# Patient Record
Sex: Male | Born: 1985 | Race: White | Hispanic: No | Marital: Single | Smoking: Current every day smoker
Health system: Southern US, Community
[De-identification: ages and names within clinical notes are randomized; demographics above are authoritative.]

## PROBLEM LIST (undated history)

## (undated) DIAGNOSIS — F419 Anxiety disorder, unspecified: Secondary | ICD-10-CM

## (undated) DIAGNOSIS — F410 Panic disorder [episodic paroxysmal anxiety] without agoraphobia: Secondary | ICD-10-CM

## (undated) HISTORY — PX: ANKLE FRACTURE SURGERY: SHX122

---
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
## 2015-11-14 ENCOUNTER — Emergency Department
Admission: EM | Admit: 2015-11-14 | Discharge: 2015-11-14 | Disposition: A | Discharge status: Home / Self Care | Payer: Self-pay | Attending: Emergency Medicine | Admitting: Emergency Medicine

## 2015-11-14 ENCOUNTER — Encounter: Payer: Self-pay | Admitting: *Deleted

## 2015-11-14 DIAGNOSIS — F17200 Nicotine dependence, unspecified, uncomplicated: Secondary | ICD-10-CM | POA: Insufficient documentation

## 2015-11-14 DIAGNOSIS — F1193 Opioid use, unspecified with withdrawal: Secondary | ICD-10-CM

## 2015-11-14 DIAGNOSIS — F1123 Opioid dependence with withdrawal: Secondary | ICD-10-CM | POA: Insufficient documentation

## 2015-11-14 LAB — CBC WITH DIFFERENTIAL/PLATELET
BASOS ABS: 0 10*3/uL (ref 0–0.1)
BASOS PCT: 0 %
EOS ABS: 0 10*3/uL (ref 0–0.7)
EOS PCT: 0 %
HCT: 47.5 % (ref 40.0–52.0)
HEMOGLOBIN: 16.2 g/dL (ref 13.0–18.0)
LYMPHS ABS: 2 10*3/uL (ref 1.0–3.6)
Lymphocytes Relative: 22 %
MCH: 28.8 pg (ref 26.0–34.0)
MCHC: 34 g/dL (ref 32.0–36.0)
MCV: 84.7 fL (ref 80.0–100.0)
Monocytes Absolute: 0.5 10*3/uL (ref 0.2–1.0)
Monocytes Relative: 6 %
NEUTROS PCT: 72 %
Neutro Abs: 6.6 10*3/uL — ABNORMAL HIGH (ref 1.4–6.5)
PLATELETS: 273 10*3/uL (ref 150–440)
RBC: 5.61 MIL/uL (ref 4.40–5.90)
RDW: 14.9 % — ABNORMAL HIGH (ref 11.5–14.5)
WBC: 9.2 10*3/uL (ref 3.8–10.6)

## 2015-11-14 LAB — COMPREHENSIVE METABOLIC PANEL
ALK PHOS: 122 U/L (ref 38–126)
ALT: 94 U/L — ABNORMAL HIGH (ref 17–63)
ANION GAP: 8 (ref 5–15)
AST: 40 U/L (ref 15–41)
Albumin: 4.5 g/dL (ref 3.5–5.0)
BUN: 14 mg/dL (ref 6–20)
CALCIUM: 9.7 mg/dL (ref 8.9–10.3)
CO2: 27 mmol/L (ref 22–32)
CREATININE: 0.74 mg/dL (ref 0.61–1.24)
Chloride: 104 mmol/L (ref 101–111)
Glucose, Bld: 96 mg/dL (ref 65–99)
Potassium: 3.7 mmol/L (ref 3.5–5.1)
SODIUM: 139 mmol/L (ref 135–145)
Total Bilirubin: 0.5 mg/dL (ref 0.3–1.2)
Total Protein: 8 g/dL (ref 6.5–8.1)

## 2015-11-14 LAB — URINE DRUG SCREEN, QUALITATIVE (ARMC ONLY)
AMPHETAMINES, UR SCREEN: NOT DETECTED
BARBITURATES, UR SCREEN: NOT DETECTED
BENZODIAZEPINE, UR SCRN: POSITIVE — AB
Cannabinoid 50 Ng, Ur ~~LOC~~: NOT DETECTED
Cocaine Metabolite,Ur ~~LOC~~: NOT DETECTED
MDMA (Ecstasy)Ur Screen: NOT DETECTED
METHADONE SCREEN, URINE: NOT DETECTED
Opiate, Ur Screen: POSITIVE — AB
Phencyclidine (PCP) Ur S: NOT DETECTED
TRICYCLIC, UR SCREEN: NOT DETECTED

## 2015-11-14 LAB — ACETAMINOPHEN LEVEL

## 2015-11-14 LAB — ETHANOL

## 2015-11-14 LAB — SALICYLATE LEVEL

## 2015-11-14 MED ORDER — DIAZEPAM 5 MG PO TABS
10.0000 mg | ORAL_TABLET | Freq: Once | ORAL | Status: AC
  Administered 2015-11-14: 10 mg via ORAL
  Filled 2015-11-14: qty 2

## 2015-11-14 MED ORDER — CYCLOBENZAPRINE HCL 10 MG PO TABS: 10.0000 mg | ORAL_TABLET | Freq: Three times a day (TID) | ORAL | 0 refills | Status: DC | PRN

## 2015-11-14 MED ORDER — ONDANSETRON 4 MG PO TBDP
4.0000 mg | ORAL_TABLET | Freq: Once | ORAL | Status: AC
  Administered 2015-11-14: 4 mg via ORAL
  Filled 2015-11-14: qty 1

## 2015-11-14 MED ORDER — PROMETHAZINE HCL 25 MG PO TABS: 25.0000 mg | ORAL_TABLET | ORAL | 1 refills | Status: DC | PRN

## 2015-11-14 MED ORDER — CLONIDINE HCL 0.1 MG/24HR TD PTWK: 0.1000 mg | MEDICATED_PATCH | TRANSDERMAL | 11 refills | Status: DC

## 2015-11-14 NOTE — ED Triage Notes (Signed)
Patient states he has been prescribed opiates for several years for several years for injuries and wants to detox. 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 Patient states he last used heroin 3 days ago and took a Suboxone twice since then to help with withdrawals, but is having difficulty dealing with the withdrawals.

## 2015-11-14 NOTE — ED Provider Notes (Signed)
[facility] Emergency Department Provider Note        Time seen: ----------------------------------------- 1:22 PM on 11/14/2015 -----------------------------------------    I have reviewed the triage vital signs and the nursing notes.   HISTORY  Chief Complaint Withdrawal    HPI Robert Miranda is a 30 y.o. male who presents to the ER for help with opiate detox. Patient states he can prescribe opiates for several years due to several injuries and toothaches and wants detox. Patient states she is now taking IV heroin, he has not used the last 3 days. He said stomach upset, vomiting, diarrhea, chills and has restlessness.   History reviewed. No pertinent past medical history.  There are no active problems to display for this patient.   Past Surgical History:  Procedure Laterality Date  . ANKLE FRACTURE SURGERY Left     Allergies Review of patient's allergies indicates no known allergies.  Social History Social History  Substance Use Topics  . Smoking status: Current Every Day Smoker  . Smokeless tobacco: Never Used  . Alcohol use No    Review of Systems Constitutional: Negative for fever.Positive for chills Cardiovascular: Negative for chest pain. Respiratory: Negative for shortness of breath. Gastrointestinal: Positive for abdominal pain, vomiting and diarrhea Genitourinary: Negative for dysuria. Musculoskeletal: Negative for back pain. Skin: Negative for rash. Neurological: Negative for headaches, focal weakness or numbness.  10-point ROS otherwise negative.  ____________________________________________   PHYSICAL EXAM:  VITAL SIGNS: ED Triage Vitals  Enc Vitals Group     BP 11/14/15 1124 116/72     Pulse Rate 11/14/15 1124 95     Resp 11/14/15 1124 18     Temp 11/14/15 1124 98.5 F (36.9 C)     Temp Source 11/14/15 1124 Oral     SpO2 11/14/15 1124 100 %     Weight 11/14/15 1124 165 lb (74.8 kg)     Height 11/14/15 1124  6\' 1"  (1.854 m)     Head Circumference --      Peak Flow --      Pain Score 11/14/15 1125 9     Pain Loc --      Pain Edu? --      Excl. in GC? --     Constitutional: Alert and oriented. Well appearing and in no distress. Eyes: Conjunctivae are normal. PERRL. Normal extraocular movements. ENT   Head: Normocephalic and atraumatic.   Nose: No congestion/rhinnorhea.   Mouth/Throat: Mucous membranes are moist.   Neck: No stridor. Cardiovascular: Normal rate, regular rhythm. No murmurs, rubs, or gallops. Respiratory: Normal respiratory effort without tachypnea nor retractions. Breath sounds are clear and equal bilaterally. No wheezes/rales/rhonchi. Gastrointestinal: Soft and nontender. Normal bowel sounds Musculoskeletal: Nontender with normal range of motion in all extremities. No lower extremity tenderness nor edema. Neurologic:  Normal speech and language. No gross focal neurologic deficits are appreciated.  Skin:  Skin is warm, dry and intact. No rash noted. Psychiatric: Mood and affect are normal. Speech and behavior are normal.  ____________________________________________  ED COURSE:  Pertinent labs & imaging results that were available during my care of the patient were reviewed by me and considered in my medical decision making (see chart for details). Clinical Course  Patient is no distress, I will provide medications to ease his withdrawal, we will discuss with the behavioral health intake nurse for outpatient detox referral.  Procedures ____________________________________________   LABS (pertinent positives/negatives)  Labs Reviewed  COMPREHENSIVE METABOLIC PANEL - Abnormal; Notable for the  following:       Result Value   ALT 94 (*)    All other components within normal limits  CBC WITH DIFFERENTIAL/PLATELET - Abnormal; Notable for the following:    RDW 14.9 (*)    Neutro Abs 6.6 (*)    All other components within normal limits  ACETAMINOPHEN LEVEL -  Abnormal; Notable for the following:    Acetaminophen (Tylenol), Serum <10 (*)    All other components within normal limits  URINE DRUG SCREEN, QUALITATIVE ([facility] ONLY) - Abnormal; Notable for the following:    Opiate, Ur Screen POSITIVE (*)    Benzodiazepine, Ur Scrn POSITIVE (*)    All other components within normal limits  ETHANOL  SALICYLATE LEVEL   ____________________________________________  FINAL ASSESSMENT AND PLAN  Substance abuse  Plan: Patient with labs as dictated above. Patient is in no distress, I will provide symptomatic treatment, he's been given outpatient referral information for opioid detox.   Emily FilbertWilliams, Jonathan E, MD   Note: This dictation was prepared with Dragon dictation. Any transcriptional errors that result from this process are unintentional    Emily FilbertJonathan E Williams, MD 11/14/15 1424

## 2017-06-21 ENCOUNTER — Encounter: Payer: Self-pay | Admitting: Emergency Medicine

## 2017-06-21 ENCOUNTER — Emergency Department
Admission: EM | Admit: 2017-06-21 | Discharge: 2017-06-21 | Discharge status: Against Medical Advice | Payer: Self-pay | Attending: Emergency Medicine | Admitting: Emergency Medicine

## 2017-06-21 DIAGNOSIS — F17200 Nicotine dependence, unspecified, uncomplicated: Secondary | ICD-10-CM | POA: Insufficient documentation

## 2017-06-21 DIAGNOSIS — R0789 Other chest pain: Secondary | ICD-10-CM | POA: Insufficient documentation

## 2017-06-21 HISTORY — DX: Anxiety disorder, unspecified: F41.9

## 2017-06-21 HISTORY — DX: Panic disorder (episodic paroxysmal anxiety): F41.0

## 2017-06-21 LAB — URINALYSIS, COMPLETE (UACMP) WITH MICROSCOPIC
BILIRUBIN URINE: NEGATIVE
GLUCOSE, UA: NEGATIVE mg/dL
Hgb urine dipstick: NEGATIVE
KETONES UR: NEGATIVE mg/dL
Leukocytes, UA: NEGATIVE
Nitrite: NEGATIVE
PROTEIN: 30 mg/dL — AB
Specific Gravity, Urine: 1.03 (ref 1.005–1.030)
pH: 6 (ref 5.0–8.0)

## 2017-06-21 LAB — COMPREHENSIVE METABOLIC PANEL
ALBUMIN: 4.9 g/dL (ref 3.5–5.0)
ALK PHOS: 94 U/L (ref 38–126)
ALT: 75 U/L — AB (ref 17–63)
AST: 35 U/L (ref 15–41)
Anion gap: 10 (ref 5–15)
BILIRUBIN TOTAL: 0.6 mg/dL (ref 0.3–1.2)
BUN: 13 mg/dL (ref 6–20)
CO2: 26 mmol/L (ref 22–32)
CREATININE: 0.86 mg/dL (ref 0.61–1.24)
Calcium: 9.4 mg/dL (ref 8.9–10.3)
Chloride: 102 mmol/L (ref 101–111)
GFR calc Af Amer: >60 mL/min (ref >60)
GLUCOSE: 94 mg/dL (ref 65–99)
POTASSIUM: 3.2 mmol/L — AB (ref 3.5–5.1)
Sodium: 138 mmol/L (ref 135–145)
TOTAL PROTEIN: 8 g/dL (ref 6.5–8.1)

## 2017-06-21 LAB — TROPONIN I

## 2017-06-21 LAB — CBC
HEMATOCRIT: 44 % (ref 40.0–52.0)
HEMOGLOBIN: 14.9 g/dL (ref 13.0–18.0)
MCH: 29 pg (ref 26.0–34.0)
MCHC: 33.8 g/dL (ref 32.0–36.0)
MCV: 86 fL (ref 80.0–100.0)
Platelets: 263 10*3/uL (ref 150–440)
RBC: 5.12 MIL/uL (ref 4.40–5.90)
RDW: 13 % (ref 11.5–14.5)
WBC: 11.1 10*3/uL — AB (ref 3.8–10.6)

## 2017-06-21 MED ORDER — IBUPROFEN 400 MG PO TABS
400.0000 mg | ORAL_TABLET | Freq: Once | ORAL | Status: DC
  Filled 2017-06-21: qty 1

## 2017-06-21 MED ORDER — ACETAMINOPHEN 325 MG PO TABS
650.0000 mg | ORAL_TABLET | Freq: Once | ORAL | Status: DC
  Filled 2017-06-21: qty 2

## 2017-06-21 NOTE — ED Provider Notes (Signed)
[facility] Emergency Department Provider Note   ____________________________________________    I have reviewed the triage vital signs and the nursing notes.   HISTORY  Chief Complaint Chest pain, chronic abdominal pain, moles    HPI Robert Miranda is a 32 y.o. male who presents with multiple complaints.  Patient states he has had 3 episodes of feeling lightheaded over the last 2 weeks, he states that he fell one time but denies injury.  He also states that he has had chest pain intermittently over the last year which he describes as a brief sharp needlelike pain.  Denies shortness of breath.  No fevers or chills.  Also complains of chronic abdominal pain for which she requests pain medication.  Additionally complains of 2 moles on his lower abdomen.  No history of heart disease   Past Medical History:  Diagnosis Date  . Anxiety   . Panic attack     There are no active problems to display for this patient.   Past Surgical History:  Procedure Laterality Date  . ANKLE FRACTURE SURGERY Left     Prior to Admission medications   Medication Sig Start Date End Date Taking? Authorizing Provider  cloNIDine (CATAPRES - DOSED IN MG/24 HR) 0.1 mg/24hr patch Place 1 patch (0.1 mg total) onto the skin every 7 (seven) days. 11/14/15 11/13/16  Emily Filbert, MD  cyclobenzaprine (FLEXERIL) 10 MG tablet Take 1 tablet (10 mg total) by mouth 3 (three) times daily as needed for muscle spasms. 11/14/15   Emily Filbert, MD  promethazine (PHENERGAN) 25 MG tablet Take 1 tablet (25 mg total) by mouth every 4 (four) hours as needed for nausea or vomiting. 11/14/15   Emily Filbert, MD     Allergies Patient has no known allergies.  No family history on file.  Social History Social History   Tobacco Use  . Smoking status: Current Every Day Smoker  . Smokeless tobacco: Never Used  Substance Use Topics  . Alcohol use: No  . Drug use: Yes   Types: IV    Comment: Heroin    Review of Systems  Constitutional: No fever/chills Eyes: No visual changes.  ENT: No neck pain Cardiovascular: Intermittent chest pain as above Respiratory: Denies shortness of breath. Gastrointestinal: Chronic abdominal pain, no nausea or vomiting Genitourinary: Negative for dysuria. Musculoskeletal: Negative for back pain. Skin: Negative for rash.  2 moles on his abdomen/skin tags Neurological: Negative for focal weakness   ____________________________________________   PHYSICAL EXAM:  VITAL SIGNS: ED Triage Vitals  Enc Vitals Group     BP 06/21/17 1928 140/74     Pulse Rate 06/21/17 1928 92     Resp 06/21/17 1928 18     Temp 06/21/17 1928 98.4 F (36.9 C)     Temp Source 06/21/17 1928 Oral     SpO2 06/21/17 1928 99 %     Weight 06/21/17 1929 79.4 kg (175 lb)     Height 06/21/17 1929 1.854 m (6\' 1" )     Head Circumference --      Peak Flow --      Pain Score 06/21/17 1928 8     Pain Loc --      Pain Edu? --      Excl. in GC? --     Constitutional: Alert and oriented. No acute distress.  Anxious Eyes: Conjunctivae are normal.  Head: Atraumatic. Nose: No congestion/rhinnorhea. Mouth/Throat: Mucous membranes are moist.    Cardiovascular:  Normal rate, regular rhythm. Grossly normal heart sounds.  Good peripheral circulation. Respiratory: Normal respiratory effort.  No retractions. Lungs CTAB. Gastrointestinal: Soft and nontender. No distention.   Genitourinary: deferred Musculoskeletal: Warm and well perfused Neurologic:  Normal speech and language. No gross focal neurologic deficits are appreciated.  Skin:  Skin is warm, dry and intact.  Psychiatric: Mood and affect are normal. Speech and behavior are normal.  ____________________________________________   LABS (all labs ordered are listed, but only abnormal results are displayed)  Labs Reviewed  CBC - Abnormal; Notable for the following components:      Result Value    WBC 11.1 (*)    All other components within normal limits  COMPREHENSIVE METABOLIC PANEL - Abnormal; Notable for the following components:   Potassium 3.2 (*)    ALT 75 (*)    All other components within normal limits  URINALYSIS, COMPLETE (UACMP) WITH MICROSCOPIC - Abnormal; Notable for the following components:   Color, Urine AMBER (*)    APPearance HAZY (*)    Protein, ur 30 (*)    Bacteria, UA RARE (*)    Squamous Epithelial / LPF 0-5 (*)    All other components within normal limits  TROPONIN I   ____________________________________________  EKG  ED ECG REPORT I, Jene Everyobert Vidyuth Belsito, the attending physician, personally viewed and interpreted this ECG.  Date: 06/21/2017  Rhythm: normal sinus rhythm QRS Axis: normal Intervals: normal ST/T Wave abnormalities: normal Narrative Interpretation: no evidence of acute ischemia  ____________________________________________  RADIOLOGY  None ____________________________________________   PROCEDURES  Procedure(s) performed: No  Procedures   Critical Care performed: No ____________________________________________   INITIAL IMPRESSION / ASSESSMENT AND PLAN / ED COURSE  Pertinent labs & imaging results that were available during my care of the patient were reviewed by me and considered in my medical decision making (see chart for details).  Patient well-appearing in no acute distress.  Multiple complaints many of which are chronic.  Reassuring exam.  Most concerning this is description of intermittent chest pain, will check labs, EKG is reassuring  Notified by the nurse that the patient is leaving AGAINST MEDICAL ADVICE.  Apparently he was angry that we did not give him narcotic pain medication for his chronic abdominal pain.  Lab work reassuring    ____________________________________________   FINAL CLINICAL IMPRESSION(S) / ED DIAGNOSES  Final diagnoses:  Atypical chest pain        Note:  This document was  prepared using Dragon voice recognition software and may include unintentional dictation errors.    Jene EveryKinner, Marvion Bastidas, MD 06/21/17 2226

## 2017-06-21 NOTE — ED Notes (Signed)
[facility] with a dog emergency at home, I advised patient he would be leaving AMA and encouraged him to return.  Verified AMA with MD.  Signature obtained for AMA.

## 2017-06-21 NOTE — ED Notes (Signed)
Pt walked to [facility] nursing station stating that he needed to leave. [facility] RN called this RN who was standing next to Dr. Cyril LoosenKinner at the time. Dr. Cyril LoosenKinner was informed that pt was leaving AMA.

## 2017-06-21 NOTE — ED Triage Notes (Signed)
Patient with complaint of intermittent lower back pain times few months. Patient states that he has also had intermittent chest pain. Patient states that the pain caused him to fall twice last week. Patient states that he hit his head when he fell and that he had loss of consciousness. Patient states that he has also had blood in his urine and that he has vomited blood.

## 2018-01-01 ENCOUNTER — Emergency Department: Payer: Self-pay

## 2018-01-01 ENCOUNTER — Encounter: Payer: Self-pay | Admitting: Emergency Medicine

## 2018-01-01 ENCOUNTER — Other Ambulatory Visit: Payer: Self-pay

## 2018-01-01 ENCOUNTER — Emergency Department
Admission: EM | Admit: 2018-01-01 | Discharge: 2018-01-01 | Disposition: A | Discharge status: Home / Self Care | Payer: Self-pay | Attending: Emergency Medicine | Admitting: Emergency Medicine

## 2018-01-01 DIAGNOSIS — F17200 Nicotine dependence, unspecified, uncomplicated: Secondary | ICD-10-CM | POA: Insufficient documentation

## 2018-01-01 DIAGNOSIS — K047 Periapical abscess without sinus: Secondary | ICD-10-CM | POA: Insufficient documentation

## 2018-01-01 LAB — COMPREHENSIVE METABOLIC PANEL
ALK PHOS: 121 U/L (ref 38–126)
ALT: 103 U/L — AB (ref 0–44)
AST: 45 U/L — AB (ref 15–41)
Albumin: 4.1 g/dL (ref 3.5–5.0)
Anion gap: 14 (ref 5–15)
BUN: 11 mg/dL (ref 6–20)
CALCIUM: 9.4 mg/dL (ref 8.9–10.3)
CHLORIDE: 100 mmol/L (ref 98–111)
CO2: 27 mmol/L (ref 22–32)
CREATININE: 0.65 mg/dL (ref 0.61–1.24)
GFR calc Af Amer: >60 mL/min (ref >60)
GFR calc non Af Amer: >60 mL/min (ref >60)
Glucose, Bld: 136 mg/dL — ABNORMAL HIGH (ref 70–99)
Potassium: 4.2 mmol/L (ref 3.5–5.1)
Sodium: 141 mmol/L (ref 135–145)
Total Bilirubin: 0.5 mg/dL (ref 0.3–1.2)
Total Protein: 7.4 g/dL (ref 6.5–8.1)

## 2018-01-01 LAB — CBC WITH DIFFERENTIAL/PLATELET
ABS IMMATURE GRANULOCYTES: 0.02 10*3/uL (ref 0.00–0.07)
BASOS ABS: 0 10*3/uL (ref 0.0–0.1)
BASOS PCT: 0 %
Eosinophils Absolute: 0 10*3/uL (ref 0.0–0.5)
Eosinophils Relative: 0 %
HCT: 46.4 % (ref 39.0–52.0)
HEMOGLOBIN: 15.7 g/dL (ref 13.0–17.0)
IMMATURE GRANULOCYTES: 0 %
LYMPHS PCT: 46 %
Lymphs Abs: 4.7 10*3/uL — ABNORMAL HIGH (ref 0.7–4.0)
MCH: 29.1 pg (ref 26.0–34.0)
MCHC: 33.8 g/dL (ref 30.0–36.0)
MCV: 86.1 fL (ref 80.0–100.0)
Monocytes Absolute: 0.6 10*3/uL (ref 0.1–1.0)
Monocytes Relative: 6 %
NEUTROS ABS: 4.8 10*3/uL (ref 1.7–7.7)
NEUTROS PCT: 48 %
NRBC: 0 % (ref 0.0–0.2)
PLATELETS: 222 10*3/uL (ref 150–400)
RBC: 5.39 MIL/uL (ref 4.22–5.81)
RDW: 12.7 % (ref 11.5–15.5)
WBC: 10.1 10*3/uL (ref 4.0–10.5)

## 2018-01-01 MED ORDER — LORAZEPAM 0.5 MG PO TABS
0.5000 mg | ORAL_TABLET | Freq: Once | ORAL | Status: AC
  Administered 2018-01-01: 0.5 mg via ORAL
  Filled 2018-01-01: qty 1

## 2018-01-01 MED ORDER — AMOXICILLIN-POT CLAVULANATE 875-125 MG PO TABS: 1.0000 | ORAL_TABLET | Freq: Two times a day (BID) | ORAL | 0 refills | Status: AC

## 2018-01-01 MED ORDER — OXYCODONE-ACETAMINOPHEN 5-325 MG PO TABS
2.0000 | ORAL_TABLET | Freq: Once | ORAL | Status: DC
  Filled 2018-01-01: qty 2

## 2018-01-01 MED ORDER — IOHEXOL 300 MG/ML  SOLN
75.0000 mL | Freq: Once | INTRAMUSCULAR | Status: AC | PRN
  Administered 2018-01-01: 75 mL via INTRAVENOUS

## 2018-01-01 MED ORDER — HYDROMORPHONE HCL 1 MG/ML IJ SOLN
1.0000 mg | Freq: Once | INTRAMUSCULAR | Status: AC
  Administered 2018-01-01: 1 mg via INTRAVENOUS
  Filled 2018-01-01: qty 1

## 2018-01-01 NOTE — ED Notes (Signed)
Pt states 1mg  of dilaudid wont work for my pain either. MD made aware and is at bedside at this time.

## 2018-01-01 NOTE — ED Notes (Signed)
Pt returned to room  

## 2018-01-01 NOTE — ED Notes (Signed)
ED Provider at bedside. 

## 2018-01-01 NOTE — ED Notes (Addendum)
EDP in room to talk to pt about DC plan and following up with dentistry.

## 2018-01-01 NOTE — ED Triage Notes (Signed)
FIRST NURSE NOTE-woke up with swelling to left jaw.  Pain to this area. Handling secretions at check in.

## 2018-01-01 NOTE — ED Notes (Signed)
PT on phone with dad to get ride home, ok per dr. Mayford Knife to wait in lobby for dad to come.

## 2018-01-01 NOTE — ED Triage Notes (Addendum)
Patient ambulatory to triage with steady gait, without difficulty or distress noted, talking incessantly; pt reports awoke with swelling to left lower jaw with pain; denies any injury or recent dental pain

## 2018-01-01 NOTE — ED Provider Notes (Signed)
[facility] Emergency Department Provider Note       Time seen: ----------------------------------------- 6:54 AM on 01/01/2018 -----------------------------------------   I have reviewed the triage vital signs and the nursing notes.  HISTORY   Chief Complaint Facial Swelling    HPI Robert Miranda is a 32 y.o. male with a history of anxiety who presents to the ED for left-sided facial swelling that was sudden in onset.  Patient has left lower jaw swelling and pain.  He denies any dental pain, denies fevers but has had some chills.  Denies chest pain or shortness of breath.  He denies vomiting or diarrhea.  Past Medical History:  Diagnosis Date  . Anxiety   . Panic attack     There are no active problems to display for this patient.   Past Surgical History:  Procedure Laterality Date  . ANKLE FRACTURE SURGERY Left     Allergies Patient has no known allergies.  Social History Social History   Tobacco Use  . Smoking status: Current Every Day Smoker  . Smokeless tobacco: Never Used  Substance Use Topics  . Alcohol use: No  . Drug use: Yes    Types: IV    Comment: Heroin   Review of Systems Constitutional: Negative for fever. ENT: Positive for jaw swelling and pain Cardiovascular: Negative for chest pain. Respiratory: Negative for shortness of breath. Gastrointestinal: Negative for abdominal pain, vomiting and diarrhea. Musculoskeletal: Negative for back pain. Skin: Negative for rash. Neurological: Negative for headaches, focal weakness or numbness.  All systems negative/normal/unremarkable except as stated in the HPI  ____________________________________________   PHYSICAL EXAM:  VITAL SIGNS: ED Triage Vitals  Enc Vitals Group     BP 01/01/18 0616 118/81     Pulse Rate 01/01/18 0616 87     Resp 01/01/18 0616 20     Temp 01/01/18 0616 98.3 F (36.8 C)     Temp Source 01/01/18 0616 Oral     SpO2 01/01/18 0616 100 %   Weight 01/01/18 0615 170 lb (77.1 kg)     Height 01/01/18 0615 6\' 1"  (1.854 m)     Head Circumference --      Peak Flow --      Pain Score 01/01/18 0615 10     Pain Loc --      Pain Edu? --      Excl. in GC? --     Constitutional: Alert and oriented. Well appearing and in no distress. ENT   Head: Normocephalic and atraumatic.   Nose: No congestion/rhinnorhea.   Mouth/Throat: Left-sided jaw swelling anteriorly   Neck: No stridor. Cardiovascular: Normal rate, regular rhythm. No murmurs, rubs, or gallops. Respiratory: Normal respiratory effort without tachypnea nor retractions. Breath sounds are clear and equal bilaterally. No wheezes/rales/rhonchi. Musculoskeletal: Nontender with normal range of motion in extremities. No lower extremity tenderness nor edema. Neurologic:  Normal speech and language. No gross focal neurologic deficits are appreciated.  Skin:  Skin is warm, dry and intact. No rash noted. Psychiatric: Anxious mood ____________________________________________  ED COURSE:  As part of my medical decision making, I reviewed the following data within the electronic MEDICAL RECORD NUMBER History obtained from family if available, nursing notes, old chart and ekg, as well as notes from prior ED visits. Patient presented for left sided jaw swelling, we will assess with labs and imaging as indicated at this time. Clinical Course as of Jan 02 855  Wed Jan 01, 2018  1610 Patient is requesting high-dose  morphine which I have advised we cannot provide.   [JW]    Clinical Course User Index [JW] Emily Filbert, MD   Procedures ____________________________________________   LABS (pertinent positives/negatives)  Labs Reviewed  CBC WITH DIFFERENTIAL/PLATELET - Abnormal; Notable for the following components:      Result Value   Lymphs Abs 4.7 (*)    All other components within normal limits  COMPREHENSIVE METABOLIC PANEL - Abnormal; Notable for the following  components:   Glucose, Bld 136 (*)    AST 45 (*)    ALT 103 (*)    All other components within normal limits  MUMPS ANTIBODY, IGG  MUMPS ANTIBODY, IGM    RADIOLOGY Images were viewed by me  CT maxillofacial IMPRESSION: LEFT facial phlegmon, presumed related to dentigerous source from tooth 20, LEFT mandibular premolar. See discussion above. ____________________________________________  DIFFERENTIAL DIAGNOSIS   Cellulitis, dental abscess, parotitis   FINAL ASSESSMENT AND PLAN  Jaw swelling, dental abscess   Plan: The patient had presented for left-sided facial swelling which appears to be from a phlegmon with no drainable abscess at this time. Patient's labs are unremarkable. Patient's imaging did reveal what appeared to be left facial phlegmon from his left mandibular premolar.  He will be placed on Augmentin and be referred to dentistry for outpatient follow-up.   Ulice Dash, MD   Note: This note was generated in part or whole with voice recognition software. Voice recognition is usually quite accurate but there are transcription errors that can and very often do occur. I apologize for any typographical errors that were not detected and corrected.     Emily Filbert, MD 01/01/18 [phone]

## 2018-01-01 NOTE — ED Notes (Signed)
Pt states he has to run out to his car, informed patient that the EDP's prefer pts to stay in their rooms until DC. Informed pt that we are waiting for his CT results and then will most likely DC.  Informed pt that his IV would be removed prior to going to out side.  Pt also stated that he needed someone to bring his pain medication because he is in a lot of pain and we cannot provide him with the pain medication he needs. Again informed him that he would be most likely discharged once the CT results were back.  Informed Dr. Mayford Knife of this.

## 2018-01-01 NOTE — ED Notes (Signed)
Pt states "I take hydromorphone and methadone for chronic pain at a high dose so these percocet's wont touch my pain, I dont want them."

## 2018-01-01 NOTE — ED Notes (Signed)
Patient transported to CT 

## 2018-01-02 LAB — MUMPS ANTIBODY, IGM: Mumps IgM: <0.8 AU (ref 0.00–0.79)

## 2018-01-02 LAB — MUMPS ANTIBODY, IGG: Mumps IgG: 50.9 AU/mL (ref >10.9)

## 2018-03-25 ENCOUNTER — Emergency Department
Admission: EM | Admit: 2018-03-25 | Discharge: 2018-03-25 | Disposition: A | Discharge status: Home / Self Care | Payer: Self-pay | Attending: Student in an Organized Health Care Education/Training Program | Admitting: Student in an Organized Health Care Education/Training Program

## 2018-03-25 ENCOUNTER — Encounter: Payer: Self-pay | Admitting: Emergency Medicine

## 2018-03-25 ENCOUNTER — Emergency Department: Payer: Self-pay

## 2018-03-25 ENCOUNTER — Other Ambulatory Visit: Payer: Self-pay

## 2018-03-25 DIAGNOSIS — S46911A Strain of unspecified muscle, fascia and tendon at shoulder and upper arm level, right arm, initial encounter: Secondary | ICD-10-CM | POA: Insufficient documentation

## 2018-03-25 DIAGNOSIS — Y999 Unspecified external cause status: Secondary | ICD-10-CM | POA: Insufficient documentation

## 2018-03-25 DIAGNOSIS — F17200 Nicotine dependence, unspecified, uncomplicated: Secondary | ICD-10-CM | POA: Insufficient documentation

## 2018-03-25 DIAGNOSIS — Y9259 Other trade areas as the place of occurrence of the external cause: Secondary | ICD-10-CM | POA: Insufficient documentation

## 2018-03-25 DIAGNOSIS — Z79899 Other long term (current) drug therapy: Secondary | ICD-10-CM | POA: Insufficient documentation

## 2018-03-25 DIAGNOSIS — S300XXA Contusion of lower back and pelvis, initial encounter: Secondary | ICD-10-CM | POA: Insufficient documentation

## 2018-03-25 DIAGNOSIS — S93401A Sprain of unspecified ligament of right ankle, initial encounter: Secondary | ICD-10-CM | POA: Insufficient documentation

## 2018-03-25 DIAGNOSIS — S20229A Contusion of unspecified back wall of thorax, initial encounter: Secondary | ICD-10-CM

## 2018-03-25 DIAGNOSIS — W01198A Fall on same level from slipping, tripping and stumbling with subsequent striking against other object, initial encounter: Secondary | ICD-10-CM | POA: Insufficient documentation

## 2018-03-25 DIAGNOSIS — Y9389 Activity, other specified: Secondary | ICD-10-CM | POA: Insufficient documentation

## 2018-03-25 MED ORDER — NABUMETONE 750 MG PO TABS: 750.0000 mg | ORAL_TABLET | Freq: Two times a day (BID) | ORAL | 0 refills | Status: DC

## 2018-03-25 NOTE — ED Notes (Signed)
Pt called out to have bed readjusted. This tech went in rm and readjusted bed. Pt stated "I have to sit either straight up or lay down flat." Pt in upright fowlers position.

## 2018-03-25 NOTE — Discharge Instructions (Addendum)
Your exam and x-rays are negative for any acute fracture of dislocation. You ankle exam shows some sign of sprain including stiffness and popping. Your shoulder is without signs of rotator cuff tear. Wear the ace bandage for support. Take the prescription med as directed. Follow-up with your provider for ongoing symptoms.

## 2018-03-25 NOTE — ED Triage Notes (Signed)
States he slipped on wet floor on Friday  Twisted right ankle and hit lower back  Min swelling noted to ankle   Good pulses

## 2018-03-25 NOTE — ED Provider Notes (Signed)
[facility] Emergency Department Provider Note ____________________________________________  Time seen: 1226  I have reviewed the triage vital signs and the nursing notes.  HISTORY  Chief Complaint  Leg Injury  HPI Robert Miranda is a 32 y.o. male who presents to the ED for evaluation of injury to the right shoulder, foot and ankle.  Patient describes a mechanical fall that occurred in the main gym of a local restaurant on Friday.  He describes slipping on some wet floor, and as he twisted his right foot and ankle he apparently fell backwards catching the stall walls between the urinals to the midline of his back.  Since that time he reports pain to the upper and lower back where he may contact with the stalls.  He also reports some pain and swelling to the right ankle.  He is also had some palpable and audible popping to the right shoulder as he ranges the shoulder circular motion.  He denies any head injury, loss of consciousness, nausea, vomiting, or dizziness.  He also denies any significant benefit with his over-the-counter pain medicines.  He presents now for further evaluation of his symptoms.  Past Medical History:  Diagnosis Date  . Anxiety   . Panic attack     There are no active problems to display for this patient.   Past Surgical History:  Procedure Laterality Date  . ANKLE FRACTURE SURGERY Left     Prior to Admission medications   Medication Sig Start Date End Date Taking? Authorizing Provider  ALPRAZolam Prudy Feeler(XANAX) 0.5 MG tablet Take 0.5 mg by mouth daily. 12/03/17   [provider]  HYDROmorphone (DILAUDID) 8 MG tablet Take 8-16 mg by mouth every 4 (four) hours as needed for severe pain.    [provider]  methadone (DOLOPHINE) 10 MG tablet Take 20 mg by mouth 2 (two) times daily. 12/20/17   [provider]  nabumetone (RELAFEN) 750 MG tablet Take 1 tablet (750 mg total) by mouth 2 (two) times daily. 03/25/18   Jairus Tonne,  Charlesetta IvoryJenise V Bacon, PA-C    Allergies Patient has no known allergies.  No family history on file.  Social History Social History   Tobacco Use  . Smoking status: Current Every Day Smoker  . Smokeless tobacco: Never Used  Substance Use Topics  . Alcohol use: No  . 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 Cardiovascular: Negative for chest pain. Respiratory: Negative for shortness of breath. Musculoskeletal: Positive for back pain. Right ankle/foot injury. Reports right shoulder pain. Skin: Negative for rash. Neurological: Negative for headaches, focal weakness or numbness. ____________________________________________  PHYSICAL EXAM:  VITAL SIGNS: ED Triage Vitals  Enc Vitals Group     BP      Pulse      Resp      Temp      Temp src      SpO2      Weight      Height      Head Circumference      Peak Flow      Pain Score      Pain Loc      Pain Edu?      Excl. in GC?     Constitutional: Alert and oriented. Well appearing and in no distress. Head: Normocephalic and atraumatic. Eyes: Conjunctivae are normal. Normal extraocular movements Cardiovascular: Normal rate, regular rhythm. Normal distal pulses.  Respiratory: Normal respiratory effort. No wheezes/rales/rhonchi. Musculoskeletal: Normal spinal alignment without midline tenderness, spasm, deformity, or step-off.  Patient with full active range of motion to the right shoulder with internal and external rotation.  No rotator cuff deficit is elicited.  There is some palpable popping to the shoulder but no sulcus sign or dislocation noted.  The patient's right foot and ankle is also without any obvious deformity, dislocation, ecchymosis, or effusion.  Patient is able to ambulate with normal gait on the right without difficulty.  There is intermittent audible popping noted to the ligaments of the right foot.  No laxity is appreciated on exam.  No calf or  Achilles tenderness is elicited.  Nontender with normal range of motion in all extremities.  Neurologic:  Mildly antalgic gait without ataxia. Normal speech and language. No gross focal neurologic deficits are appreciated. Skin:  Skin is warm, dry and intact. No rash noted. ____________________________________________   RADIOLOGY  Lumbar Spine] IMPRESSION: Negative.  Right Ankle IMPRESSION: Negative. ____________________________________________  PROCEDURES  Procedures ____________________________________________  INITIAL IMPRESSION / ASSESSMENT AND PLAN / ED COURSE  She with ED evaluation of injury sustained following a mechanical fall.  Patient clinical picture is overall reassuring.  His x-rays are negative for any acute findings.  Symptoms likely represent a sprain to the right ankle.  Contusion to the lower back as well as some right shoulder strain.  Patient is referred to primary care provider in 1 week for further evaluation and management of any symptoms that persist.  Work note is provided for 2 days as requested. ____________________________________________  FINAL CLINICAL IMPRESSION(S) / ED DIAGNOSES  Final diagnoses:  Sprain of right ankle, unspecified ligament, initial encounter  Contusion of back, unspecified laterality, initial encounter  Shoulder strain, right, initial encounter      Lissa HoardMenshew, Maizee Reinhold V Bacon, PA-C 03/25/18 [id number]    Willy Eddyobinson, Patrick, MD 03/27/18 [phone]

## 2018-04-14 ENCOUNTER — Emergency Department
Admission: EM | Admit: 2018-04-14 | Discharge: 2018-04-14 | Disposition: A | Discharge status: Home / Self Care | Payer: Medicaid Other | Attending: Emergency Medicine | Admitting: Emergency Medicine

## 2018-04-14 ENCOUNTER — Other Ambulatory Visit: Payer: Self-pay

## 2018-04-14 ENCOUNTER — Encounter: Payer: Self-pay | Admitting: Emergency Medicine

## 2018-04-14 DIAGNOSIS — K047 Periapical abscess without sinus: Secondary | ICD-10-CM | POA: Insufficient documentation

## 2018-04-14 DIAGNOSIS — F17210 Nicotine dependence, cigarettes, uncomplicated: Secondary | ICD-10-CM | POA: Insufficient documentation

## 2018-04-14 DIAGNOSIS — F419 Anxiety disorder, unspecified: Secondary | ICD-10-CM | POA: Insufficient documentation

## 2018-04-14 DIAGNOSIS — Z79899 Other long term (current) drug therapy: Secondary | ICD-10-CM | POA: Insufficient documentation

## 2018-04-14 MED ORDER — METHYLPREDNISOLONE SODIUM SUCC 125 MG IJ SOLR
125.0000 mg | Freq: Once | INTRAMUSCULAR | Status: AC
  Administered 2018-04-14: 125 mg via INTRAMUSCULAR
  Filled 2018-04-14: qty 2

## 2018-04-14 MED ORDER — LIDOCAINE VISCOUS HCL 2 % MT SOLN: 10.0000 mL | OROMUCOSAL | 0 refills | Status: DC | PRN

## 2018-04-14 MED ORDER — AMOXICILLIN 500 MG PO CAPS: 500.0000 mg | ORAL_CAPSULE | Freq: Three times a day (TID) | ORAL | 0 refills | Status: DC

## 2018-04-14 MED ORDER — AMOXICILLIN 500 MG PO CAPS
500.0000 mg | ORAL_CAPSULE | Freq: Once | ORAL | Status: AC
  Administered 2018-04-14: 500 mg via ORAL
  Filled 2018-04-14: qty 1

## 2018-04-14 MED ORDER — HYDROXYZINE HCL 25 MG PO TABS
25.0000 mg | ORAL_TABLET | Freq: Once | ORAL | Status: AC
  Administered 2018-04-14: 25 mg via ORAL
  Filled 2018-04-14: qty 1

## 2018-04-14 NOTE — ED Provider Notes (Signed)
[facility] Emergency Department Provider Note  ____________________________________________  Time seen: Approximately 5:28 PM  I have reviewed the triage vital signs and the nursing notes.   HISTORY  Chief Complaint Dental Pain    HPI Robert Miranda is a 33 y.o. male presents emergency department for evaluation of left-sided cheek swelling and dental pain for 1 day.  Patient states that pain started over to having teeth that are loose.  His cheek started swelling very quickly shortly after.  He has a history of anxiety and this is making him anxious.  He was evaluated here for similar 3 months ago.  He never filled the prescription for antibiotics because swelling improved on its own.  He never followed up with dentist.  No fever, chills.  Past Medical History:  Diagnosis Date  . Anxiety   . Panic attack     There are no active problems to display for this patient.   Past Surgical History:  Procedure Laterality Date  . ANKLE FRACTURE SURGERY Left     Prior to Admission medications   Medication Sig Start Date End Date Taking? Authorizing Provider  ALPRAZolam Prudy Feeler) 0.5 MG tablet Take 0.5 mg by mouth daily. 12/03/17   [provider]  amoxicillin (AMOXIL) 500 MG capsule Take 1 capsule (500 mg total) by mouth 3 (three) times daily. 04/14/18   Enid Derry, PA-C  HYDROmorphone (DILAUDID) 8 MG tablet Take 8-16 mg by mouth every 4 (four) hours as needed for severe pain.    [provider]  lidocaine (XYLOCAINE) 2 % solution Use as directed 10 mLs in the mouth or throat as needed for mouth pain. 04/14/18   Enid Derry, PA-C  methadone (DOLOPHINE) 10 MG tablet Take 20 mg by mouth 2 (two) times daily. 12/20/17   [provider]  nabumetone (RELAFEN) 750 MG tablet Take 1 tablet (750 mg total) by mouth 2 (two) times daily. 03/25/18   Menshew, Charlesetta Ivory, PA-C    Allergies Patient has no known allergies.  No family history on  file.  Social History Social History   Tobacco Use  . Smoking status: Current Every Day Smoker  . Smokeless tobacco: Never Used  Substance Use Topics  . Alcohol use: No  . 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 No discharge. ENT: Negative for congestion and rhinorrhea. Cardiovascular: No chest pain. Respiratory: Negative for cough. No SOB. Gastrointestinal: No abdominal pain.  No nausea, no vomiting.  Musculoskeletal: Negative for musculoskeletal pain. Skin: Negative for rash, abrasions, lacerations, ecchymosis. Neurological: Negative for headaches.   ____________________________________________   PHYSICAL EXAM:  VITAL SIGNS: ED Triage Vitals [04/14/18 1654]  Enc Vitals Group     BP 135/64     Pulse Rate 66     Resp 20     Temp 97.6 F (36.4 C)     Temp Source Oral     SpO2 100 %     Weight 180 lb (81.6 kg)     Height 6\' 1"  (1.854 m)     Head Circumference      Peak Flow      Pain Score 10     Pain Loc      Pain Edu?      Excl. in GC?      Constitutional: Alert and oriented. Well appearing and in no acute distress. Eyes: Conjunctivae are  normal. PERRL. EOMI. No discharge. Head: Atraumatic. ENT: No frontal and maxillary sinus tenderness.      Ears: Tympanic membranes pearly gray with good landmarks. No discharge.      Nose: Mild congestion/rhinnorhea.      Mouth/Throat: Mucous membranes are moist. Oropharynx non-erythematous. Tonsils not enlarged. No exudates. Uvula midline.  Tenderness to palpation surrounding tooth #20.  Moderate swelling to left cheek.  Full range of motion of jaw. Neck: No stridor.   Hematological/Lymphatic/Immunilogical: No cervical lymphadenopathy. Cardiovascular: Normal rate, regular rhythm.  Good peripheral circulation. Respiratory: Normal respiratory effort without tachypnea or retractions. Lungs CTAB. Good air entry to the bases with no  decreased or absent breath sounds. Gastrointestinal: Bowel sounds 4 quadrants. Soft and nontender to palpation. No guarding or rigidity. No palpable masses. No distention. Musculoskeletal: Full range of motion to all extremities. No gross deformities appreciated. Neurologic:  Normal speech and language. No gross focal neurologic deficits are appreciated.  Skin:  Skin is warm, dry and intact. No rash noted. Psychiatric: Mood and affect are normal. Speech and behavior are normal. Patient exhibits appropriate insight and judgement.   ____________________________________________   LABS (all labs ordered are listed, but only abnormal results are displayed)  Labs Reviewed - No data to display ____________________________________________  EKG   ____________________________________________  RADIOLOGY   No results found.  ____________________________________________    PROCEDURES  Procedure(s) performed:    Procedures    Medications  hydrOXYzine (ATARAX/VISTARIL) tablet 25 mg (has no administration in time range)  methylPREDNISolone sodium succinate (SOLU-MEDROL) 125 mg/2 mL injection 125 mg (125 mg Intramuscular Given 04/14/18 1808)  amoxicillin (AMOXIL) capsule 500 mg (500 mg Oral Given 04/14/18 1808)     ____________________________________________   INITIAL IMPRESSION / ASSESSMENT AND PLAN / ED COURSE  Pertinent labs & imaging results that were available during my care of the patient were reviewed by me and considered in my medical decision making (see chart for details).  Review of the [place] CSRS was performed in accordance of the [facility] prior to dispensing any controlled drugs.   Patient's diagnosis is consistent with dental abscess. Vital signs and exam are reassuring.  Dental resources were provided.  Patient will be discharged home with prescriptions for amoxicillin and viscous lidocaine. Patient is to follow up with dentist as needed or otherwise directed. Patient  is given ED precautions to return to the ED for any worsening or new symptoms.     ____________________________________________  FINAL CLINICAL IMPRESSION(S) / ED DIAGNOSES  Final diagnoses:  Dental abscess      NEW MEDICATIONS STARTED DURING THIS VISIT:  ED Discharge Orders         Ordered    amoxicillin (AMOXIL) 500 MG capsule  3 times daily     04/14/18 1815    lidocaine (XYLOCAINE) 2 % solution  As needed     04/14/18 1815              This chart was dictated using voice recognition software/Dragon. Despite best efforts to proofread, errors can occur which can change the meaning. Any change was purely unintentional.    Enid DerryWagner, Sion Reinders, PA-C 04/14/18 1839    Sharyn CreamerQuale, Mark, MD 04/16/18 2049

## 2018-04-14 NOTE — ED Notes (Signed)
See triage note  Presents with pain and swelling to left jaw   Possible dental abscess

## 2018-04-14 NOTE — ED Notes (Signed)
NAD noted at time of D/C. Pt denies questions or concerns. Pt ambulatory to the lobby at this time.  

## 2018-04-14 NOTE — ED Triage Notes (Signed)
L lower dental pain since yesterday, visible swelling.

## 2018-04-14 NOTE — ED Triage Notes (Signed)
First Nurse Note:  Left dental swelling to left lower jaw x 1 day.  States had similar episode 3 months ago, has not followed up with a dentist.  Did not take prescribed antibiotics.

## 2018-04-14 NOTE — Discharge Instructions (Addendum)
OPTIONS FOR DENTAL FOLLOW UP CARE ° °[place] Department of Health and Human Services - Local Safety Net Dental Clinics °http://www.ncdhhs.gov/dph/oralhealth/services/safetynetclinics.htm °  °[facility] (336-562-3123) ° °[facility] [place] ([phone]) ° °[facility] ([phone] ext [phone]) ° °[facility] ([phone]) ° °[facility] ([phone]) °This clinic caters to the indigent population and is on a lottery system. °Location: °[facility], [address], [place] °Clinic Hours: °Wednesdays from 6pm - 9pm, patients seen by a lottery system. °For dates, call or go to www.med.[facility].edu/[facility]/patients/Dental-[facility] °Services: °Cleanings, fillings and simple extractions. °Payment Options: °DENTAL WORK IS FREE OF CHARGE. Bring proof of income or support. °Best way to get seen: °Arrive at 5:15 pm - this is a lottery, NOT first come/first serve, so arriving earlier will not increase your chances of being seen. °  °  °[facility] °[phone] °Select option 1 for emergencies °  °Location: °[facility], [address], [place] °Clinic Hours: °No walk-ins accepted - call the day before to schedule an appointment. °Check in times are 9:30 am and 1:30 pm. °Services: °Simple extractions, temporary fillings, pulpectomy/pulp debridement, uncomplicated abscess drainage. °Payment Options: °PAYMENT IS DUE AT THE TIME OF SERVICE.  Fee is usually $100-200, additional surgical procedures (e.g. abscess drainage) may be extra. °Cash, checks, Visa/MasterCard accepted.  Can file Medicaid if patient is covered for dental - patient should call case worker to check. °No discount for [facility] patients. °Best way to get seen: °MUST call the day before and get onto the schedule. Can usually be seen the next 1-2 days. No walk-ins accepted. °  °  °[facility] °[phone] °   °Location: °[facility], [address], [place] °Clinic Hours: °M, W, Th, F 8am or 1:30pm, Tues 9a or 1:30 - first come/first served. °Services: °Simple extractions, temporary fillings, uncomplicated abscess drainage.  You do not need to be an [place] resident. °Payment Options: °PAYMENT IS DUE AT THE TIME OF SERVICE. °Dental insurance, otherwise sliding scale - bring proof of income or support. °Depending on income and treatment needed, cost is usually $50-200. °Best way to get seen: °Arrive early as it is first come/first served. °  °  °[facility] °[phone] °  °Location: °[address] °Clinic Hours: °Mon-Thu 8a-5p °Services: °Most basic dental services including extractions and fillings. °Payment Options: °PAYMENT IS DUE AT THE TIME OF SERVICE. °Sliding scale, up to 50% off - bring proof if income or support. °Medicaid with dental option accepted. °Best way to get seen: °Call to schedule an appointment, can usually be seen within 2 weeks OR they will try to see walk-ins - show up at 8a or 2p (you may have to wait). °  °  °[facility] °[phone] °[place] RESIDENTS ONLY °  °Location: °[facility], [address], [place], [place] [address] °Clinic Hours: By appointment only. °Monday - Thursday 8am-5pm, Friday 8am-12pm °Services: Cleanings, fillings, extractions. °Payment Options: °PAYMENT IS DUE AT THE TIME OF SERVICE. °Cash, Visa or MasterCard. Sliding scale - $30 minimum per service. °Best way to get seen: °Come in to office, complete packet and make an appointment - need proof of income °or support monies for each household member and proof of [place] residence. °Usually takes about a month to get in. °  °  °[facility] °[phone] °  °Location: °[address]   [place] °Clinic Hours: Walk-in Urgent Care Dental Services are offered Monday-Friday  mornings only. °The numbers of emergencies accepted daily is limited to the number of °providers available. °Maximum 15 - Mondays, Wednesdays & Thursdays °Maximum 10 - Tuesdays & Fridays °Services: °You do not need to be a [place] resident to be seen for a dental emergency. °Emergencies are defined as pain, swelling, abnormal bleeding, or dental trauma. Walkins will receive x-rays if needed. °NOTE: Dental cleaning is not an emergency. °Payment Options: °PAYMENT IS DUE AT THE TIME OF SERVICE. °Minimum co-pay is $40.00 for uninsured patients. °Minimum co-pay is $3.00 for Medicaid with dental coverage. °Dental Insurance is accepted and must be presented at time of visit. °Medicare does not cover dental. °Forms of payment: Cash, credit card, checks. °Best way to get seen: °If not previously registered with the clinic, walk-in dental registration begins at 7:15 am and is on a first come/first serve basis. °If previously registered with the clinic, call to make an appointment. °  °  °[facility] °[phone] °[place] RESIDENTS ONLY °  °Location: °[address], [place], [place] °Clinic Hours: °Mon-Thu 10a-2p °Services: Extractions only! °Payment Options: °FREE (donations accepted) - bring proof of income or support °Best way to get seen: °Call and schedule an appointment OR come at 8am on the 1st Monday of every month (except for holidays) when it is first come/first served. °  °  °[facility] °[phone] °  °Location: °[address], [place] °Clinic Hours: °Friday mornings °Services, Payment Options, Best way to get seen: °Call for info °

## 2018-09-13 ENCOUNTER — Encounter: Payer: Self-pay | Admitting: Emergency Medicine

## 2018-09-13 ENCOUNTER — Other Ambulatory Visit: Payer: Self-pay

## 2018-09-13 ENCOUNTER — Emergency Department
Admission: EM | Admit: 2018-09-13 | Discharge: 2018-09-13 | Disposition: A | Discharge status: Home / Self Care | Payer: Medicaid Other | Attending: Emergency Medicine | Admitting: Emergency Medicine

## 2018-09-13 DIAGNOSIS — M25572 Pain in left ankle and joints of left foot: Secondary | ICD-10-CM | POA: Insufficient documentation

## 2018-09-13 DIAGNOSIS — F17200 Nicotine dependence, unspecified, uncomplicated: Secondary | ICD-10-CM | POA: Insufficient documentation

## 2018-09-13 DIAGNOSIS — Z79899 Other long term (current) drug therapy: Secondary | ICD-10-CM | POA: Insufficient documentation

## 2018-09-13 DIAGNOSIS — F1920 Other psychoactive substance dependence, uncomplicated: Secondary | ICD-10-CM | POA: Insufficient documentation

## 2018-09-13 DIAGNOSIS — M25571 Pain in right ankle and joints of right foot: Secondary | ICD-10-CM | POA: Insufficient documentation

## 2018-09-13 DIAGNOSIS — M2550 Pain in unspecified joint: Secondary | ICD-10-CM

## 2018-09-13 LAB — CBC WITH DIFFERENTIAL/PLATELET
Abs Immature Granulocytes: 0.03 10*3/uL (ref 0.00–0.07)
Basophils Absolute: 0 10*3/uL (ref 0.0–0.1)
Basophils Relative: 0 %
Eosinophils Absolute: 0.2 10*3/uL (ref 0.0–0.5)
Eosinophils Relative: 2 %
HCT: 38.8 % — ABNORMAL LOW (ref 39.0–52.0)
Hemoglobin: 12.9 g/dL — ABNORMAL LOW (ref 13.0–17.0)
Immature Granulocytes: 0 %
Lymphocytes Relative: 36 %
Lymphs Abs: 3.5 10*3/uL (ref 0.7–4.0)
MCH: 28.2 pg (ref 26.0–34.0)
MCHC: 33.2 g/dL (ref 30.0–36.0)
MCV: 84.9 fL (ref 80.0–100.0)
Monocytes Absolute: 0.8 10*3/uL (ref 0.1–1.0)
Monocytes Relative: 8 %
Neutro Abs: 5.1 10*3/uL (ref 1.7–7.7)
Neutrophils Relative %: 54 %
Platelets: 201 10*3/uL (ref 150–400)
RBC: 4.57 MIL/uL (ref 4.22–5.81)
RDW: 13.1 % (ref 11.5–15.5)
Smear Review: NORMAL
WBC: 9.5 10*3/uL (ref 4.0–10.5)
nRBC: 0 % (ref 0.0–0.2)

## 2018-09-13 LAB — BASIC METABOLIC PANEL
Anion gap: 9 (ref 5–15)
BUN: 16 mg/dL (ref 6–20)
CO2: 24 mmol/L (ref 22–32)
Calcium: 9.1 mg/dL (ref 8.9–10.3)
Chloride: 105 mmol/L (ref 98–111)
Creatinine, Ser: 0.62 mg/dL (ref 0.61–1.24)
GFR calc Af Amer: >60 mL/min (ref >60)
GFR calc non Af Amer: >60 mL/min (ref >60)
Glucose, Bld: 110 mg/dL — ABNORMAL HIGH (ref 70–99)
Potassium: 4.1 mmol/L (ref 3.5–5.1)
Sodium: 138 mmol/L (ref 135–145)

## 2018-09-13 LAB — BRAIN NATRIURETIC PEPTIDE: B Natriuretic Peptide: 18 pg/mL (ref 0.0–100.0)

## 2018-09-13 LAB — TROPONIN I: Troponin I: <0.03 ng/mL (ref <0.03)

## 2018-09-13 MED ORDER — DEXAMETHASONE SODIUM PHOSPHATE 10 MG/ML IJ SOLN
10.0000 mg | Freq: Once | INTRAMUSCULAR | Status: AC
  Administered 2018-09-13: 10 mg via INTRAMUSCULAR
  Filled 2018-09-13: qty 1

## 2018-09-13 NOTE — ED Triage Notes (Signed)
Pt to ED via POV c/o bilateral LE edema. Pt states that he noticed this 2 days ago. Pt has bilateral mild pitting edema in bilateral LE that extends half way up his calf. Pt denies cardiac hx. Pt has + pulses in both feet. Skin is warm and dry.

## 2018-09-13 NOTE — ED Provider Notes (Signed)
[facility] Emergency Department Provider Note   ____________________________________________    I have reviewed the triage vital signs and the nursing notes.   HISTORY  Chief Complaint Leg Swelling     HPI Robert Miranda is a 33 y.o. male who presents with complaints of bilateral ankle pain, some wrist discomfort as well which he noticed when he woke up this morning.  He reports that this makes it uncomfortable for him to walk.  He does report a history of chronic pain for which he takes methadone as well as severe anxiety for which he takes a number of medications.  He denies fevers or chills.  Denies IVDA. No cp or sob. No recent infections. Currently reports ankles are feeling improved.  Past Medical History:  Diagnosis Date  . Anxiety   . Panic attack     There are no active problems to display for this patient.   Past Surgical History:  Procedure Laterality Date  . ANKLE FRACTURE SURGERY Left     Prior to Admission medications   Medication Sig Start Date End Date Taking? Authorizing Provider  ALPRAZolam Prudy Feeler(XANAX) 0.5 MG tablet Take 0.5 mg by mouth daily. 12/03/17   [provider]  amoxicillin (AMOXIL) 500 MG capsule Take 1 capsule (500 mg total) by mouth 3 (three) times daily. 04/14/18   Enid DerryWagner, Ashley, PA-C  HYDROmorphone (DILAUDID) 8 MG tablet Take 8-16 mg by mouth every 4 (four) hours as needed for severe pain.    [provider]  lidocaine (XYLOCAINE) 2 % solution Use as directed 10 mLs in the mouth or throat as needed for mouth pain. 04/14/18   Enid DerryWagner, Ashley, PA-C  methadone (DOLOPHINE) 10 MG tablet Take 20 mg by mouth 2 (two) times daily. 12/20/17   [provider]  nabumetone (RELAFEN) 750 MG tablet Take 1 tablet (750 mg total) by mouth 2 (two) times daily. 03/25/18   Menshew, Charlesetta IvoryJenise V Bacon, PA-C     Allergies Patient has no known allergies.  No family history on file.  Social History Social History    Tobacco Use  . Smoking status: Current Every Day Smoker  . Smokeless tobacco: Never Used  Substance Use Topics  . Alcohol use: No  . 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  ENT: No sore throat. Cardiovascular: Denies chest pain. Respiratory: Denies shortness of breath. Gastrointestinal: No abdominal pain.  No nausea, no vomiting.   Genitourinary: Negative for dysuria. Musculoskeletal: as above Skin: no rash Neurological: Negative for headaches or weakness   ____________________________________________   PHYSICAL EXAM:  VITAL SIGNS: ED Triage Vitals  Enc Vitals Group     BP 09/13/18 0952 140/74     Pulse Rate 09/13/18 0952 (!) 110     Resp 09/13/18 0952 16     Temp 09/13/18 0952 98.8 F (37.1 C)     Temp Source 09/13/18 0952 Oral     SpO2 09/13/18 0952 96 %     Weight 09/13/18 0953 83.9 kg (185 lb)     Height 09/13/18 0953 1.854 m (6\' 1" )     Head Circumference --      Peak Flow --      Pain Score 09/13/18 0952 8     Pain Loc --      Pain Edu? --      Excl. in GC? --  Constitutional: Alert and oriented. No acute distress. anxious Eyes: Conjunctivae are normal.   Nose: No congestion/rhinnorhea. Mouth/Throat: Mucous membranes are moist.    Cardiovascular: Normal rate, regular rhythm. Grossly normal heart sounds.  Good peripheral circulation. Respiratory: Normal respiratory effort.  No retractions. Lungs CTAB. Gastrointestinal: Soft and nontender. No distention.  Musculoskeletal: ankles: no significant swelling, no redness, normal ROM, well perfused extremities, able to bear weight Neurologic:  Normal speech and language. No gross focal neurologic deficits are appreciated.  Skin:  Skin is warm, dry and intact. No rash noted. Psychiatric: Mood and affect are normal. Speech and behavior are normal.  ____________________________________________   LABS (all labs  ordered are listed, but only abnormal results are displayed)  Labs Reviewed  CBC WITH DIFFERENTIAL/PLATELET - Abnormal; Notable for the following components:      Result Value   Hemoglobin 12.9 (*)    HCT 38.8 (*)    All other components within normal limits  BASIC METABOLIC PANEL - Abnormal; Notable for the following components:   Glucose, Bld 110 (*)    All other components within normal limits  BRAIN NATRIURETIC PEPTIDE  TROPONIN I   ____________________________________________  EKG   ____________________________________________  RADIOLOGY   ____________________________________________   PROCEDURES  Procedure(s) performed: No  Procedures   Critical Care performed: No ____________________________________________   INITIAL IMPRESSION / ASSESSMENT AND PLAN / ED COURSE  Pertinent labs & imaging results that were available during my care of the patient were reviewed by me and considered in my medical decision making (see chart for details).  Patient presents with unexplained polyarthralgia primarily in the ankles bilaterally, no fevers or chills, no IV drug abuse, lab work is quite reassuring, normal white blood cell count.  Exam is overall reassuring as well, no recent viral infections however symptoms do started today and seem to be improving already.  The patient is quite anxious and repeatedly asking for anxiety medication.  He is already on large quantities of pain medication and anxiety medications.  We will treat with IM Decadron and have asked him to follow-up as an outpatient, return precautions to the emergency department discussed    ____________________________________________   FINAL CLINICAL IMPRESSION(S) / ED DIAGNOSES  Final diagnoses:  Polyarthralgia        Note:  This document was prepared using Dragon voice recognition software and may include unintentional dictation errors.   Lavonia Drafts, MD 09/13/18 2231

## 2019-01-03 ENCOUNTER — Other Ambulatory Visit: Payer: Self-pay

## 2019-01-03 DIAGNOSIS — Z5321 Procedure and treatment not carried out due to patient leaving prior to being seen by health care provider: Secondary | ICD-10-CM | POA: Insufficient documentation

## 2019-01-03 NOTE — ED Triage Notes (Signed)
Patient reports having a panic attack for several hours and he is out of his medications.

## 2019-01-04 ENCOUNTER — Emergency Department
Admission: EM | Admit: 2019-01-04 | Discharge: 2019-01-04 | Disposition: A | Discharge status: Home / Self Care | Payer: Medicaid Other | Attending: Emergency Medicine | Admitting: Emergency Medicine

## 2019-01-05 ENCOUNTER — Telehealth: Payer: Self-pay | Admitting: Licensed Clinical Social Worker

## 2019-01-05 NOTE — Telephone Encounter (Signed)
Patient left vm requesting an appointment. LCSW returned call and left patient a vm.

## 2019-03-09 ENCOUNTER — Emergency Department
Admission: EM | Admit: 2019-03-09 | Discharge: 2019-03-09 | Disposition: A | Discharge status: Home / Self Care | Payer: Self-pay | Attending: Emergency Medicine | Admitting: Emergency Medicine

## 2019-03-09 ENCOUNTER — Other Ambulatory Visit: Payer: Self-pay

## 2019-03-09 ENCOUNTER — Encounter: Payer: Self-pay | Admitting: Emergency Medicine

## 2019-03-09 ENCOUNTER — Emergency Department: Payer: Self-pay

## 2019-03-09 DIAGNOSIS — F17210 Nicotine dependence, cigarettes, uncomplicated: Secondary | ICD-10-CM | POA: Insufficient documentation

## 2019-03-09 DIAGNOSIS — R0789 Other chest pain: Secondary | ICD-10-CM | POA: Insufficient documentation

## 2019-03-09 DIAGNOSIS — R945 Abnormal results of liver function studies: Secondary | ICD-10-CM | POA: Insufficient documentation

## 2019-03-09 DIAGNOSIS — Z20828 Contact with and (suspected) exposure to other viral communicable diseases: Secondary | ICD-10-CM | POA: Insufficient documentation

## 2019-03-09 DIAGNOSIS — R7989 Other specified abnormal findings of blood chemistry: Secondary | ICD-10-CM

## 2019-03-09 DIAGNOSIS — Z20822 Contact with and (suspected) exposure to covid-19: Secondary | ICD-10-CM

## 2019-03-09 DIAGNOSIS — F419 Anxiety disorder, unspecified: Secondary | ICD-10-CM | POA: Insufficient documentation

## 2019-03-09 LAB — URINALYSIS, COMPLETE (UACMP) WITH MICROSCOPIC
Bacteria, UA: NONE SEEN
Bilirubin Urine: NEGATIVE
Glucose, UA: NEGATIVE mg/dL
Hgb urine dipstick: NEGATIVE
Ketones, ur: NEGATIVE mg/dL
Leukocytes,Ua: NEGATIVE
Nitrite: NEGATIVE
Protein, ur: NEGATIVE mg/dL
Specific Gravity, Urine: 1.012 (ref 1.005–1.030)
Squamous Epithelial / LPF: NONE SEEN (ref 0–5)
pH: 6 (ref 5.0–8.0)

## 2019-03-09 LAB — CBC
HCT: 47.8 % (ref 39.0–52.0)
Hemoglobin: 15.9 g/dL (ref 13.0–17.0)
MCH: 28.1 pg (ref 26.0–34.0)
MCHC: 33.3 g/dL (ref 30.0–36.0)
MCV: 84.6 fL (ref 80.0–100.0)
Platelets: 234 10*3/uL (ref 150–400)
RBC: 5.65 MIL/uL (ref 4.22–5.81)
RDW: 13.2 % (ref 11.5–15.5)
WBC: 9.2 10*3/uL (ref 4.0–10.5)
nRBC: 0 % (ref 0.0–0.2)

## 2019-03-09 LAB — COMPREHENSIVE METABOLIC PANEL
ALT: 182 U/L — ABNORMAL HIGH (ref 0–44)
AST: 77 U/L — ABNORMAL HIGH (ref 15–41)
Albumin: 4.5 g/dL (ref 3.5–5.0)
Alkaline Phosphatase: 134 U/L — ABNORMAL HIGH (ref 38–126)
Anion gap: 10 (ref 5–15)
BUN: 16 mg/dL (ref 6–20)
CO2: 29 mmol/L (ref 22–32)
Calcium: 9.6 mg/dL (ref 8.9–10.3)
Chloride: 102 mmol/L (ref 98–111)
Creatinine, Ser: 0.74 mg/dL (ref 0.61–1.24)
GFR calc Af Amer: >60 mL/min (ref >60)
GFR calc non Af Amer: >60 mL/min (ref >60)
Glucose, Bld: 124 mg/dL — ABNORMAL HIGH (ref 70–99)
Potassium: 4 mmol/L (ref 3.5–5.1)
Sodium: 141 mmol/L (ref 135–145)
Total Bilirubin: 0.7 mg/dL (ref 0.3–1.2)
Total Protein: 7.9 g/dL (ref 6.5–8.1)

## 2019-03-09 LAB — TROPONIN I (HIGH SENSITIVITY): Troponin I (High Sensitivity): 4 ng/L (ref <18)

## 2019-03-09 MED ORDER — LORAZEPAM 1 MG PO TABS: 1.0000 mg | ORAL_TABLET | Freq: Two times a day (BID) | ORAL | 0 refills | Status: DC | PRN

## 2019-03-09 MED ORDER — ALPRAZOLAM 0.5 MG PO TABS
0.5000 mg | ORAL_TABLET | Freq: Once | ORAL | Status: AC
  Administered 2019-03-09: 0.5 mg via ORAL
  Filled 2019-03-09: qty 1

## 2019-03-09 MED ORDER — LORAZEPAM 1 MG PO TABS
1.0000 mg | ORAL_TABLET | Freq: Once | ORAL | Status: DC
  Filled 2019-03-09: qty 1

## 2019-03-09 MED ORDER — ALPRAZOLAM 0.25 MG PO TABS: 0.2500 mg | ORAL_TABLET | Freq: Three times a day (TID) | ORAL | 0 refills | Status: DC | PRN

## 2019-03-09 NOTE — Discharge Instructions (Signed)
Please follow up with your PCP to recheck your liver function tests as we discussed

## 2019-03-09 NOTE — ED Provider Notes (Signed)
[facility] Emergency Department Provider Note   ____________________________________________    I have reviewed the triage vital signs and the nursing notes.   HISTORY  Chief Complaint Chest Pain and Panic Attack     HPI Robert Miranda is a 33 y.o. male who presents with complaints of anxiety and mild chest discomfort.  Patient reports he has had nasal congestion, fatigue and myalgias for several days now.  He reports he also has mild sore throat which he states always makes him anxious, he states he is having difficulty sleeping because of severe anxiety.  Has not been Covid tested.  Is not sure if he has had fever but has had sweats.  This morning had some chest discomfort, now resolved which he attributes to anxiety.  No shortness of breath.  No significant cough  Past Medical History:  Diagnosis Date  . Anxiety   . Panic attack     There are no problems to display for this patient.   Past Surgical History:  Procedure Laterality Date  . ANKLE FRACTURE SURGERY Left     Prior to Admission medications   Medication Sig Start Date End Date Taking? Authorizing Provider  ALPRAZolam (XANAX) 0.25 MG tablet Take 1 tablet (0.25 mg total) by mouth 3 (three) times daily as needed for anxiety. 03/09/19 03/08/20  Jene Every, MD     Allergies Patient has no known allergies.  No family history on file.  Social History Social History   Tobacco Use  . Smoking status: Current Every Day Smoker  . Smokeless tobacco: Never Used  Substance Use Topics  . Alcohol use: Yes    Comment: occ  . Drug use: Not Currently    Types: IV    Comment: Heroin    Review of Systems  Constitutional: As above Eyes: No visual changes.  ENT: As above Cardiovascular: As above Respiratory: Denies shortness of breath. Gastrointestinal: No abdominal pain.  No nausea, no vomiting.   Genitourinary: Negative for dysuria. Musculoskeletal: Myalgias Skin: Negative  for rash. Neurological: Negative for headaches or weakness   ____________________________________________   PHYSICAL EXAM:  VITAL SIGNS: ED Triage Vitals  Enc Vitals Group     BP 03/09/19 0629 124/69     Pulse Rate 03/09/19 0629 78     Resp 03/09/19 0629 18     Temp 03/09/19 0629 98.1 F (36.7 C)     Temp Source 03/09/19 0629 Oral     SpO2 03/09/19 0629 98 %     Weight 03/09/19 0630 83.9 kg (185 lb)     Height 03/09/19 0630 1.854 m (6\' 1" )     Head Circumference --      Peak Flow --      Pain Score 03/09/19 0630 7     Pain Loc --      Pain Edu? --      Excl. in GC? --     Constitutional: Alert and oriented.   Nose: No congestion/rhinnorhea. Mouth/Throat: Mucous membranes are moist.    Cardiovascular: Normal rate, regular rhythm.  Good peripheral circulation. Respiratory: Normal respiratory effort.  No retractions.  Gastrointestinal: No distention  Musculoskeletal:  Warm and well perfused Neurologic:  Normal speech and language. No gross focal neurologic deficits are appreciated.  Skin:  Skin is warm, dry and intact. No rash noted. Psychiatric: Mildly anxious speech and behavior are normal.  ____________________________________________   LABS (all labs ordered are listed, but only abnormal results are displayed)  Labs Reviewed  COMPREHENSIVE METABOLIC PANEL - Abnormal; Notable for the following components:      Result Value   Glucose, Bld 124 (*)    AST 77 (*)    ALT 182 (*)    Alkaline Phosphatase 134 (*)    All other components within normal limits  URINALYSIS, COMPLETE (UACMP) WITH MICROSCOPIC - Abnormal; Notable for the following components:   Color, Urine YELLOW (*)    APPearance CLEAR (*)    All other components within normal limits  CBC  TROPONIN I (HIGH SENSITIVITY)   ____________________________________________  EKG  ED ECG REPORT I, Lavonia Drafts, the attending physician, personally viewed and interpreted this ECG.  Date:  03/09/2019  Rhythm: normal sinus rhythm QRS Axis: normal Intervals: normal ST/T Wave abnormalities: normal Narrative Interpretation: no evidence of acute ischemia  ____________________________________________  RADIOLOGY  Chest x-ray unremarkable ____________________________________________   PROCEDURES  Procedure(s) performed: No  Procedures   Critical Care performed: No ____________________________________________   INITIAL IMPRESSION / ASSESSMENT AND PLAN / ED COURSE  Pertinent labs & imaging results that were available during my care of the patient were reviewed by me and considered in my medical decision making (see chart for details).  Patient well-appearing in no acute distress, EKG is reassuring, symptoms not consistent with ACS.  Strongly suspicious for COVID-19 infection given myalgias, congestion, viral symptoms, fatigue.  Patient has not been tested, he is refusing test now because of his anxiety does not want nasopharyngeal swab.  I recommended quarantine.  Also discussed with him mildly elevated LFTs which will require recheck in 2 to 3 weeks, may be related to COVID-19 infection.  Chest x-ray reassuring, vitals reassuring.  Will give short course of benzos for anxiety, outpatient follow-up    ____________________________________________   FINAL CLINICAL IMPRESSION(S) / ED DIAGNOSES  Final diagnoses:  Suspected COVID-19 virus infection  Anxiety  Elevated LFTs        Note:  This document was prepared using Dragon voice recognition software and may include unintentional dictation errors.   Lavonia Drafts, MD 03/09/19 [phone]

## 2019-03-09 NOTE — ED Notes (Signed)
Pt has been stuck several times by Melody,EDT, myself and Collyn,RN with no success. Melanie,EDT to try once pt returns from XR.

## 2019-03-09 NOTE — ED Notes (Signed)
Answered pts questions on if he should be covid tested, discussed with Dr Corky Downs who encouraged covid testing post discharge. Pt verbalized understanding.

## 2019-03-09 NOTE — ED Notes (Signed)
Pt in triage getting blood draw, will be brought to flex after blood draw.

## 2019-03-09 NOTE — ED Triage Notes (Signed)
Patient states that he has had congestion and sore throat times one week. Patient states that he started having chest pain yesterday. Patient also states that he started having pain in his kidneys yesterday. Patient with complaint of an ammonia taste in his mouth that he can not get rid of. Patient also complain of panic attack that started last night.

## 2019-03-09 NOTE — ED Notes (Signed)
See triage note  Presents with sore throat and some chest discomfort  States he started having an anxiety attack around 63 mn  States he is out of his meds

## 2019-11-19 ENCOUNTER — Emergency Department
Admission: EM | Admit: 2019-11-19 | Discharge: 2019-11-20 | Disposition: A | Discharge status: Home / Self Care | Payer: Medicaid Other | Attending: Emergency Medicine | Admitting: Emergency Medicine

## 2019-11-19 ENCOUNTER — Encounter: Payer: Self-pay | Admitting: *Deleted

## 2019-11-19 ENCOUNTER — Other Ambulatory Visit: Payer: Self-pay

## 2019-11-19 DIAGNOSIS — F10920 Alcohol use, unspecified with intoxication, uncomplicated: Secondary | ICD-10-CM

## 2019-11-19 DIAGNOSIS — F10120 Alcohol abuse with intoxication, uncomplicated: Secondary | ICD-10-CM | POA: Insufficient documentation

## 2019-11-19 DIAGNOSIS — R45851 Suicidal ideations: Secondary | ICD-10-CM | POA: Insufficient documentation

## 2019-11-19 DIAGNOSIS — Y906 Blood alcohol level of 120-199 mg/100 ml: Secondary | ICD-10-CM | POA: Insufficient documentation

## 2019-11-19 DIAGNOSIS — F17200 Nicotine dependence, unspecified, uncomplicated: Secondary | ICD-10-CM | POA: Insufficient documentation

## 2019-11-19 LAB — COMPREHENSIVE METABOLIC PANEL
ALT: 58 U/L — ABNORMAL HIGH (ref 0–44)
AST: 33 U/L (ref 15–41)
Albumin: 4.7 g/dL (ref 3.5–5.0)
Alkaline Phosphatase: 86 U/L (ref 38–126)
Anion gap: 16 — ABNORMAL HIGH (ref 5–15)
BUN: <5 mg/dL — ABNORMAL LOW (ref 6–20)
CO2: 25 mmol/L (ref 22–32)
Calcium: 9.6 mg/dL (ref 8.9–10.3)
Chloride: 107 mmol/L (ref 98–111)
Creatinine, Ser: 0.96 mg/dL (ref 0.61–1.24)
GFR calc Af Amer: >60 mL/min (ref >60)
GFR calc non Af Amer: >60 mL/min (ref >60)
Glucose, Bld: 92 mg/dL (ref 70–99)
Potassium: 3.7 mmol/L (ref 3.5–5.1)
Sodium: 148 mmol/L — ABNORMAL HIGH (ref 135–145)
Total Bilirubin: 0.7 mg/dL (ref 0.3–1.2)
Total Protein: 7.6 g/dL (ref 6.5–8.1)

## 2019-11-19 LAB — ETHANOL: Alcohol, Ethyl (B): 160 mg/dL — ABNORMAL HIGH (ref <10)

## 2019-11-19 LAB — CBC
HCT: 44.3 % (ref 39.0–52.0)
Hemoglobin: 14.9 g/dL (ref 13.0–17.0)
MCH: 28.8 pg (ref 26.0–34.0)
MCHC: 33.6 g/dL (ref 30.0–36.0)
MCV: 85.7 fL (ref 80.0–100.0)
Platelets: 342 10*3/uL (ref 150–400)
RBC: 5.17 MIL/uL (ref 4.22–5.81)
RDW: 12.9 % (ref 11.5–15.5)
WBC: 12.6 10*3/uL — ABNORMAL HIGH (ref 4.0–10.5)
nRBC: 0 % (ref 0.0–0.2)

## 2019-11-19 LAB — ACETAMINOPHEN LEVEL: Acetaminophen (Tylenol), Serum: <10 ug/mL — ABNORMAL LOW (ref 10–30)

## 2019-11-19 LAB — SALICYLATE LEVEL: Salicylate Lvl: <7 mg/dL — ABNORMAL LOW (ref 7.0–30.0)

## 2019-11-19 MED ORDER — HALOPERIDOL LACTATE 5 MG/ML IJ SOLN
INTRAMUSCULAR | Status: AC
  Administered 2019-11-19: 10 mg via INTRAMUSCULAR
  Filled 2019-11-19: qty 1

## 2019-11-19 MED ORDER — DIPHENHYDRAMINE HCL 50 MG/ML IJ SOLN: 50.0000 mg | Freq: Four times a day (QID) | INTRAMUSCULAR | Status: DC | PRN

## 2019-11-19 MED ORDER — LORAZEPAM 2 MG/ML IJ SOLN: 2.0000 mg | Freq: Once | INTRAMUSCULAR | Status: AC

## 2019-11-19 MED ORDER — LORAZEPAM 2 MG PO TABS
2.0000 mg | ORAL_TABLET | Freq: Four times a day (QID) | ORAL | Status: DC | PRN
  Administered 2019-11-20: 2 mg via ORAL
  Filled 2019-11-19: qty 1

## 2019-11-19 MED ORDER — ZIPRASIDONE MESYLATE 20 MG IM SOLR: 20.0000 mg | Freq: Four times a day (QID) | INTRAMUSCULAR | Status: DC | PRN

## 2019-11-19 MED ORDER — DIPHENHYDRAMINE HCL 50 MG/ML IJ SOLN
INTRAMUSCULAR | Status: AC
  Administered 2019-11-19: 50 mg via INTRAMUSCULAR
  Filled 2019-11-19: qty 1

## 2019-11-19 MED ORDER — DIPHENHYDRAMINE HCL 50 MG/ML IJ SOLN: 50.0000 mg | Freq: Once | INTRAMUSCULAR | Status: AC

## 2019-11-19 MED ORDER — LORAZEPAM 2 MG/ML IJ SOLN
INTRAMUSCULAR | Status: AC
  Administered 2019-11-19: 2 mg via INTRAMUSCULAR
  Filled 2019-11-19: qty 1

## 2019-11-19 MED ORDER — HALOPERIDOL LACTATE 5 MG/ML IJ SOLN: 10.0000 mg | Freq: Once | INTRAMUSCULAR | Status: AC

## 2019-11-19 MED ORDER — LORAZEPAM 2 MG/ML IJ SOLN: 2.0000 mg | Freq: Four times a day (QID) | INTRAMUSCULAR | Status: DC | PRN

## 2019-11-19 NOTE — ED Notes (Signed)
Pt attempted to stand from bed and was very unsteady on his feet swaying from side to side.  Pt assisted back to bed by RN and myself.  Pt continues to toss and turn in bed.

## 2019-11-19 NOTE — ED Notes (Signed)
Pt refusing to dress out.  Pt taken to room [facility]

## 2019-11-19 NOTE — ED Notes (Signed)
IVC, pend psych consult 

## 2019-11-19 NOTE — ED Provider Notes (Addendum)
[facility] Emergency Department Provider Note   ____________________________________________   First MD Initiated Contact with Patient 11/19/19 1721     (approximate)  I have reviewed the triage vital signs and the nursing notes.   HISTORY  Chief Complaint Behavior Problem History limited by patient lack of cooperation what  HPI Robert Miranda is a 34 y.o. male patient comes in under commitment.  [place] papers say say that he had a rope set up in his backyard to hang himself.  Police officer corroborates this information.  He was making suicidal text to his friends.  Here in the emergency room he has loud violent and threatening in the face of the officers.  I attempted to talk him down myself and could not.  He is continuing to be threatening and loud and not cooperative.  We will be forced to hold him briefly and sedate him with Haldol and Ativan so he does not hurt himself or anyone else.  His behavior is frankly scary.      Past Medical History:  Diagnosis Date  . Anxiety   . Panic attack     There are no problems to display for this patient.   Past Surgical History:  Procedure Laterality Date  . ANKLE FRACTURE SURGERY Left     Prior to Admission medications   Medication Sig Start Date End Date Taking? Authorizing Provider  ALPRAZolam (XANAX) 0.25 MG tablet Take 1 tablet (0.25 mg total) by mouth 3 (three) times daily as needed for anxiety. 03/09/19 03/08/20  Jene Every, MD    Allergies Patient has no known allergies.  No family history on file.  Social History Social History   Tobacco Use  . Smoking status: Current Every Day Smoker  . Smokeless tobacco: Never Used  Substance Use Topics  . Alcohol use: Yes    Comment: occ  . Drug use: Not Currently    Types: IV    Comment: Heroin    Review of Systems  Patient is not cooperating and will not answer these  questions  ____________________________________________   PHYSICAL EXAM:  VITAL SIGNS: ED Triage Vitals  Enc Vitals Group     BP 11/19/19 1707 (!) 139/91     Pulse Rate 11/19/19 1707 96     Resp 11/19/19 1707 20     Temp 11/19/19 1707 98.4 F (36.9 C)     Temp Source 11/19/19 1707 Oral     SpO2 11/19/19 1707 96 %     Weight 11/19/19 1708 180 lb (81.6 kg)     Height 11/19/19 1708 6\' 1"  (1.854 m)     Head Circumference --      Peak Flow --      Pain Score --      Pain Loc --      Pain Edu? --      Excl. in GC? --     Constitutional: Patient is alert and yelling and screaming Eyes: Conjunctivae are normal. PER EOMI. Head: Atraumatic. Nose: No congestion/rhinnorhea. Mouth/Throat: Mucous membranes are moist.  Oropharynx non-erythematous. Neck: No stridor.  Cardiovascular:   Good peripheral circulation. Respiratory: Normal respiratory effort.  No retractions.  Gastrointestinal:  No distention.  Musculoskeletal: No lower extremity  edema.   Neurologic:  Normal speech and language. No gross focal neurologic deficits are appreciated. No gait instability. Skin:  Skin is warm, dry and intact. No rash noted. Psychiatric: Patient's mood is detailed in HPI  ____________________________________________   LABS (all labs ordered  are listed, but only abnormal results are displayed)  Labs Reviewed  COMPREHENSIVE METABOLIC PANEL - Abnormal; Notable for the following components:      Result Value   Sodium 148 (*)    BUN <5 (*)    ALT 58 (*)    Anion gap 16 (*)    All other components within normal limits  ETHANOL - Abnormal; Notable for the following components:   Alcohol, Ethyl (B) 160 (*)    All other components within normal limits  CBC - Abnormal; Notable for the following components:   WBC 12.6 (*)    All other components within normal limits  ACETAMINOPHEN LEVEL - Abnormal; Notable for the following components:   Acetaminophen (Tylenol), Serum <10 (*)    All other  components within normal limits  SALICYLATE LEVEL - Abnormal; Notable for the following components:   Salicylate Lvl <7.0 (*)    All other components within normal limits  URINE DRUG SCREEN, QUALITATIVE ([facility] ONLY)   ____________________________________________  EKG   ____________________________________________  RADIOLOGY  ED MD interpretation:   Official radiology report(s): No results found.  ____________________________________________   PROCEDURES  Procedure(s) performed (including Critical Care):  Procedures   ____________________________________________   INITIAL IMPRESSION / ASSESSMENT AND PLAN / ED COURSE  Patient currently involuntarily committed.  He is sedated.  His dispo will be per psychiatry.  He is also apparently intoxicated.  Certainly legally he is intoxicated.       Behavioral Restraint Provider Note:  Behavioral Indicators: Patient violent and confrontational of threatening others occluding myself and the psychiatrist to also try to talk him down    Reaction to intervention: Patient becomes worse when we try to talk him down   Review of systems: Patient held and given a shot and then released the shot works and he used to become restful and sleepy     History: See HPI patient was just seen by me in all of the HPI and rest of the history was just completed before the restraints   Mental Status Exam: Patient is awake and alert and knows what he is doing except for being intoxicated  Restraint Continuation: See above    Restraint Rationale Continuation: See above          ____________________________________________   FINAL CLINICAL IMPRESSION(S) / ED DIAGNOSES  Final diagnoses:  Suicidal ideation  Alcoholic intoxication without complication [facility])     ED Discharge Orders    None       Note:  This document was prepared using Dragon voice recognition software and may include unintentional dictation  errors.    Arnaldo Natal, MD 11/19/19 4097    Arnaldo Natal, MD 11/20/19 0710    Arnaldo Natal, MD 11/20/19 [phone]

## 2019-11-19 NOTE — ED Provider Notes (Addendum)
-----------------------------------------   11:11 PM on 11/19/2019 -----------------------------------------  The patient has been placed in psychiatric observation due to the need to provide a safe environment for the patient while obtaining psychiatric consultation and evaluation, as well as ongoing medical and medication management to treat the patient's condition.  The patient has been placed under full IVC at this time.   Loleta Rose, MD 11/20/19 [phone]

## 2019-11-19 NOTE — ED Triage Notes (Addendum)
Pt brought in by [employer] officers.  Pt is IVC.  Pt cursing in triage and loud.  Pt reports drug and etoh use today.  Slurred speech noted in triage.   ivc papers report pt had a rope hanging in a tree today to hand himself.  Pt denies SI or HI  Pt states he took a lot xanax today.

## 2019-11-19 NOTE — ED Notes (Signed)
Patient brought in by police officer under IVC. Patient with etoh like breath observed trying to hang himself. Patient verbally abusive, and uncooperative. IM administered as ordered.

## 2019-11-19 NOTE — ED Notes (Signed)
Pt very restless in bed, continues to toss and turn.  Pillow and blanket given.

## 2019-11-19 NOTE — ED Notes (Signed)
Patient calm, tossing and turning in bed.

## 2019-11-19 NOTE — ED Notes (Addendum)
Psych at bedside to consult patient.  

## 2019-11-19 NOTE — Consult Note (Addendum)
Robert Miranda Face-to-Face Psychiatry Consult   Reason for Consult:  Severe OOC behavior under intoxication   Referring Physician:  ER MD  Patient Identification: Robert Miranda MRN:  [id number] Principal Diagnosis:   ETOH intoxication Xanax  Excess Robert Miranda over use Generalized Anxiety  Acute Stress Reaction  Complicated bereavement   Acute SI --statements  Patient is acutely intoxicated and OOC control aggressive and explosive ---after acute stress reaction  Came to ER extremely agitated beligerent explosive shouting.  ON IVC  Because he is reported to have texted his friend he was going to Suicide.  Police came and found him intoxicated and was tying rope around tree.  Brought to ER and has been severely agitated.     IM haldol 10 Ativan 2 and Benadryl 50 initially given and he continued to shout  Other Prn's on standby   History from calling Dad  Friend could not be reached  Patient lost another friend via suicide a week ago ---is in grief and went to that person's Dad's house.  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 male patient admitted with see above   HPI:   See above   Past Psychiatric History:    None except anxiety and bereavement   Risk to Self:  very High  Risk to Others:  Very high  Prior Inpatient Therapy:  none  Prior Outpatient Therapy:  none   Past Medical History:  Past Medical History:  Diagnosis Date  . Anxiety   . Panic attack     Past Surgical History:  Procedure Laterality Date  . ANKLE FRACTURE SURGERY Left    Family History: No family history on file. Family Psychiatric  History:   None  Social History: see above    Social History   Substance and Sexual Activity  Alcohol Use Yes   Comment: occ     Social History   Substance and Sexual Activity  Drug Use Not Currently  . Types: IV   Comment: Heroin    Social History   Socioeconomic History  . Marital status: Single    Spouse name: Not on file  . Number of children: Not on file  . Years of education: Not on file  . Highest education level: Not on file  Occupational History  . Not on file  Tobacco Use  . Smoking status: Current Every Day Smoker  . Smokeless tobacco: Never Used  Substance and Sexual Activity  . Alcohol use: Yes    Comment:  occ  . Drug use: Not Currently    Types: IV    Comment: Heroin  . Sexual activity: Not on file  Other Topics Concern  . Not on file  Social History Narrative  . Not on file   Social Determinants of Health   Financial Resource Strain:   . Difficulty of Paying Living Expenses: Not on file  Food Insecurity:   . Worried About Programme researcher, broadcasting/film/video in the Last Year: Not on file  . Ran Out of Food in the Last Year: Not on file  Transportation Needs:   . Lack of Transportation (Medical): Not on file  . Lack of Transportation (Non-Medical): Not on file  Physical Activity:   . Days of Exercise per Week: Not on file  . Minutes of Exercise per Session: Not on file  Stress:   . Feeling of Stress : Not on file  Social Connections:   . Frequency of Communication with Friends and Family: Not on file  . Frequency of Social Gatherings with Friends  and Family: Not on file  . Attends Religious Services: Not on file  . Active Member of Clubs or Organizations: Not on file  . Attends Banker Meetings: Not on file  . Marital Status: Not on file   Additional Social History:  None for now       Allergies:  No Known Allergies  Labs: No results found for this or any previous visit (from the past 48 hour(s)).  Current Facility-Administered Medications  Medication Dose Route Frequency Provider Last Rate Last Admin  . diphenhydrAMINE (BENADRYL) 50 MG/ML injection           . diphenhydrAMINE (BENADRYL) injection 50 mg  50 mg Intramuscular Once Arnaldo Natal, MD      . haloperidol lactate (HALDOL) 5 MG/ML injection           . haloperidol lactate (HALDOL) 5 MG/ML injection           . haloperidol lactate (HALDOL) injection 10 mg  10 mg Intramuscular Once Arnaldo Natal, MD      . LORazepam (ATIVAN) 2 MG/ML injection           . LORazepam (ATIVAN) injection 2 mg  2 mg Intramuscular Once Arnaldo Natal, MD       Current Outpatient Medications  Medication Sig Dispense Refill  . ALPRAZolam (XANAX) 0.25 MG tablet Take 1 tablet (0.25 mg total) by mouth 3 (three) times daily as needed for anxiety. 12 tablet 0    Musculoskeletal: Strength & Muscle Tone: normal  Gait & Station: normal but he is agitated  Patient leans: not applicable  Assets  Caring father Liabilities --ETOH intoxication Akathisia none Cognition impaired Handedness not known  Recall poor  Language English  Psychomotor activity very elevated  Sleep impaired   Psychiatric Specialty Exam: Physical Exam  Review of Systems  Blood pressure (!) 139/91, pulse 96, temperature 98.4 F (36.9 C), temperature source Oral, resp. rate 20, height 6\' 1"  (1.854 m), weight 81.6 kg, SpO2 96 %.Body mass index is 23.75 kg/m.    Mental status   Intoxicated caucasian male  Oriented to person Argumentative, shouting threatening beligerent  Consciousness clouded  fluctuant Mood elevated angry labile  Affect elevated angry labile Thought process and content --delusional fearful loud themes Speech Loud pressured Movements ---no shakes tremors and tics Concentration and attention imparied Language multiple curse words Memory cannot assess  SI ---active no safety plan   threated to  hang Self while intoxicated HI none  Threatens to harm others though Judgement insight reliability impaired Abstraction impaired                                                      Treatment Plan Summary:  Patient acutely intoxicated substance and or ETOH eval pending ---on IVC for threats and actual actions to hang self and texting a friend about his intent to harm self   Needs IVC and Miranda ER Eval and obs with medications   Dad has been informed as next of kin        Disposition:   Remains in house for now in ER under psych and medical eval and observation   Roselind Messier, MD 11/19/2019 5:42 PM

## 2019-11-19 NOTE — ED Notes (Signed)
Black belt Sunglasses Cell phone Id card  1 dollar 25 cents 1 light blue tee shirt Gray striped shorts Blacks sneakers  White footies Blue hanes underwear

## 2019-11-20 LAB — URINE DRUG SCREEN, QUALITATIVE (ARMC ONLY)
Amphetamines, Ur Screen: NOT DETECTED
Barbiturates, Ur Screen: NOT DETECTED
Benzodiazepine, Ur Scrn: POSITIVE — AB
Cannabinoid 50 Ng, Ur ~~LOC~~: POSITIVE — AB
Cocaine Metabolite,Ur ~~LOC~~: NOT DETECTED
MDMA (Ecstasy)Ur Screen: NOT DETECTED
Methadone Scn, Ur: NOT DETECTED
Opiate, Ur Screen: NOT DETECTED
Phencyclidine (PCP) Ur S: NOT DETECTED
Tricyclic, Ur Screen: NOT DETECTED

## 2019-11-20 MED ORDER — DROPERIDOL 2.5 MG/ML IJ SOLN
5.0000 mg | Freq: Once | INTRAMUSCULAR | Status: AC
  Administered 2019-11-20: 5 mg via INTRAMUSCULAR

## 2019-11-20 MED ORDER — DROPERIDOL 2.5 MG/ML IJ SOLN
5.0000 mg | Freq: Once | INTRAMUSCULAR | Status: DC
  Filled 2019-11-20: qty 2

## 2019-11-20 NOTE — BH Assessment (Signed)
Per request of Psych MD Smith Robert), writer provided the patient with information and instructions on how to access Outpatient Mental Health & Substance Abuse Treatment ([facility]).  Patient denies SI/HI and AV/H.  ________________ [facility] [address]  [place], [place] [address] [phone]  [facility] [address],  [place], [place] [address] [phone]

## 2019-11-20 NOTE — Discharge Instructions (Addendum)
Please follow-up with [facility] if you have any further thoughts of hurting yourself.  Please return here if need be.  Please be careful not to drink so much next time.  It is likely that many of your problems yesterday were from too much alcohol

## 2019-11-20 NOTE — ED Notes (Signed)
Dr. York Cerise at the bedside to discuss pt plan of care. Pt verbalized understanding and asked for a shot of something to help him sleep.

## 2019-11-20 NOTE — ED Notes (Signed)
Patient discharged to home, unable to find patients shoes. multiple placed looked, by multiple people. Shoes were not to be found. Other personal belongings placed in bag abnd lock in bhu, returned to patient. Patient did not want to stick around ed while we continue to look for his shoes. Stated he wanted to leave and if shoes are found call him and he will returned to pick them up.

## 2019-11-20 NOTE — ED Notes (Signed)
Patient calm and cooperative, up early sitting on side of bed. Asking when will the psych doctor come see hime today. States he is getting anxious. po meds given for anxiety. Will continue to monitor.

## 2019-11-20 NOTE — ED Notes (Signed)
Patient verbalized anxiety has calmed down. Requested shower. Patient given oral and bathing supplies, patient showered and sitting in bed awaiting breakfast patiently.
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
## 2019-11-20 NOTE — ED Notes (Signed)
Patient calm and cooperative. Psych at bedside to discuss discharge plan of care.
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
## 2019-11-20 NOTE — ED Notes (Signed)
Pt taking a shower, given scrub top and bottoms, socks to change into.

## 2019-11-20 NOTE — ED Notes (Signed)
Pt resting on stretcher with eyes closed and even respirations. No acute distress noted at this time. Safety and security provided for. Will continue to assess.

## 2019-11-20 NOTE — ED Notes (Signed)
Pt awake and asking to speak with someone regarding how long he would be staying in the ED. Pt states he is wanting to go home. Appears irritated but easily redirected verbally.

## 2019-11-20 NOTE — ED Notes (Signed)
Pt given breakfast tray

## 2020-02-05 IMAGING — DX DG ANKLE COMPLETE 3+V*R*
3 series · 3 of 3 positions shown · non-contrast
Comparison: None.

CLINICAL DATA: Fall, twisted ankle

EXAM:
RIGHT ANKLE - COMPLETE 3+ VIEW

[ankle ap]
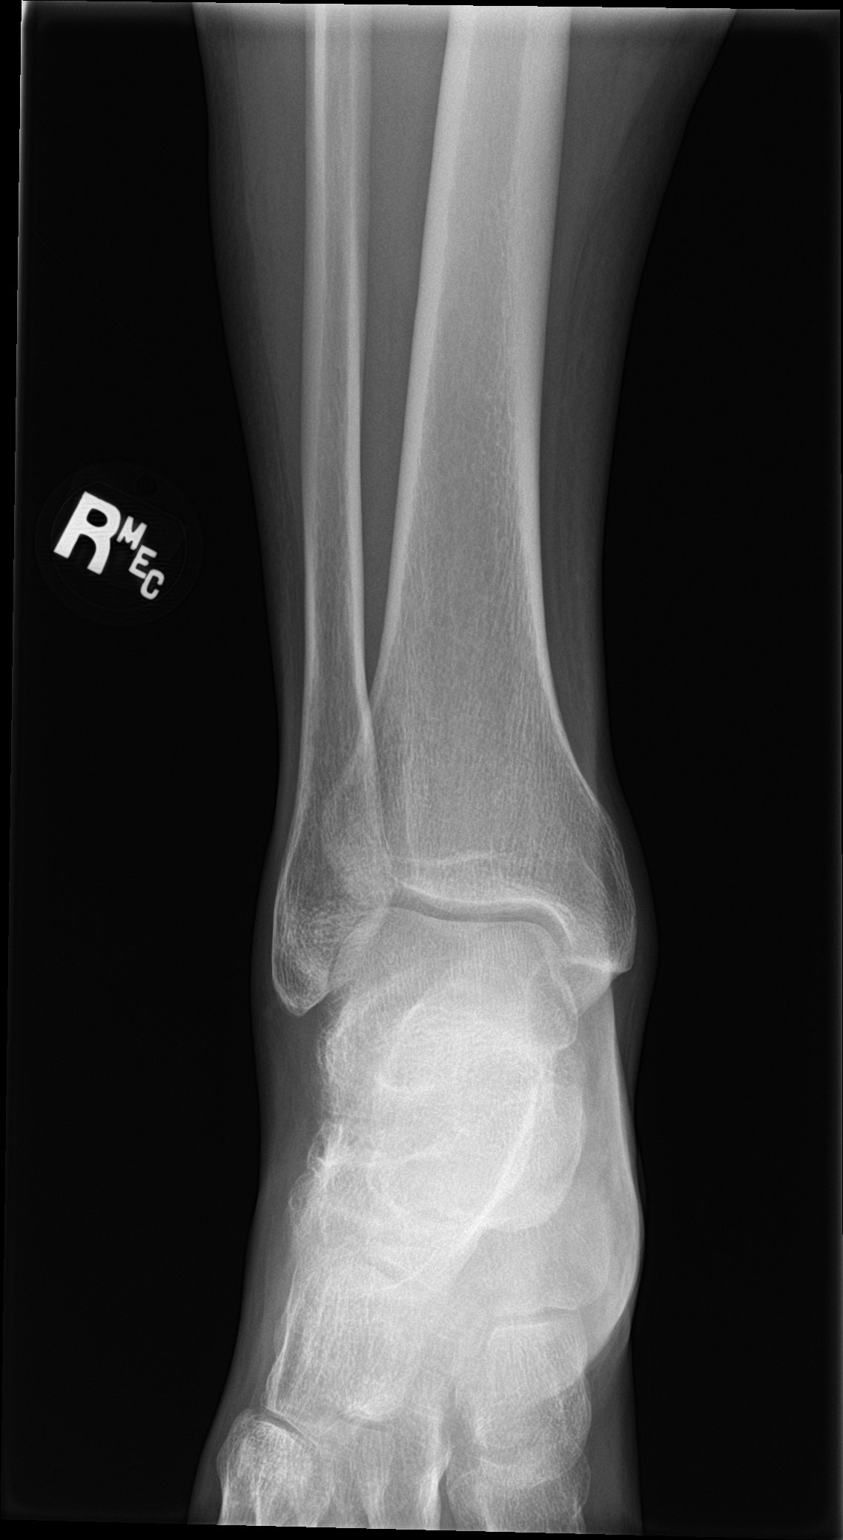

[ankle obl]
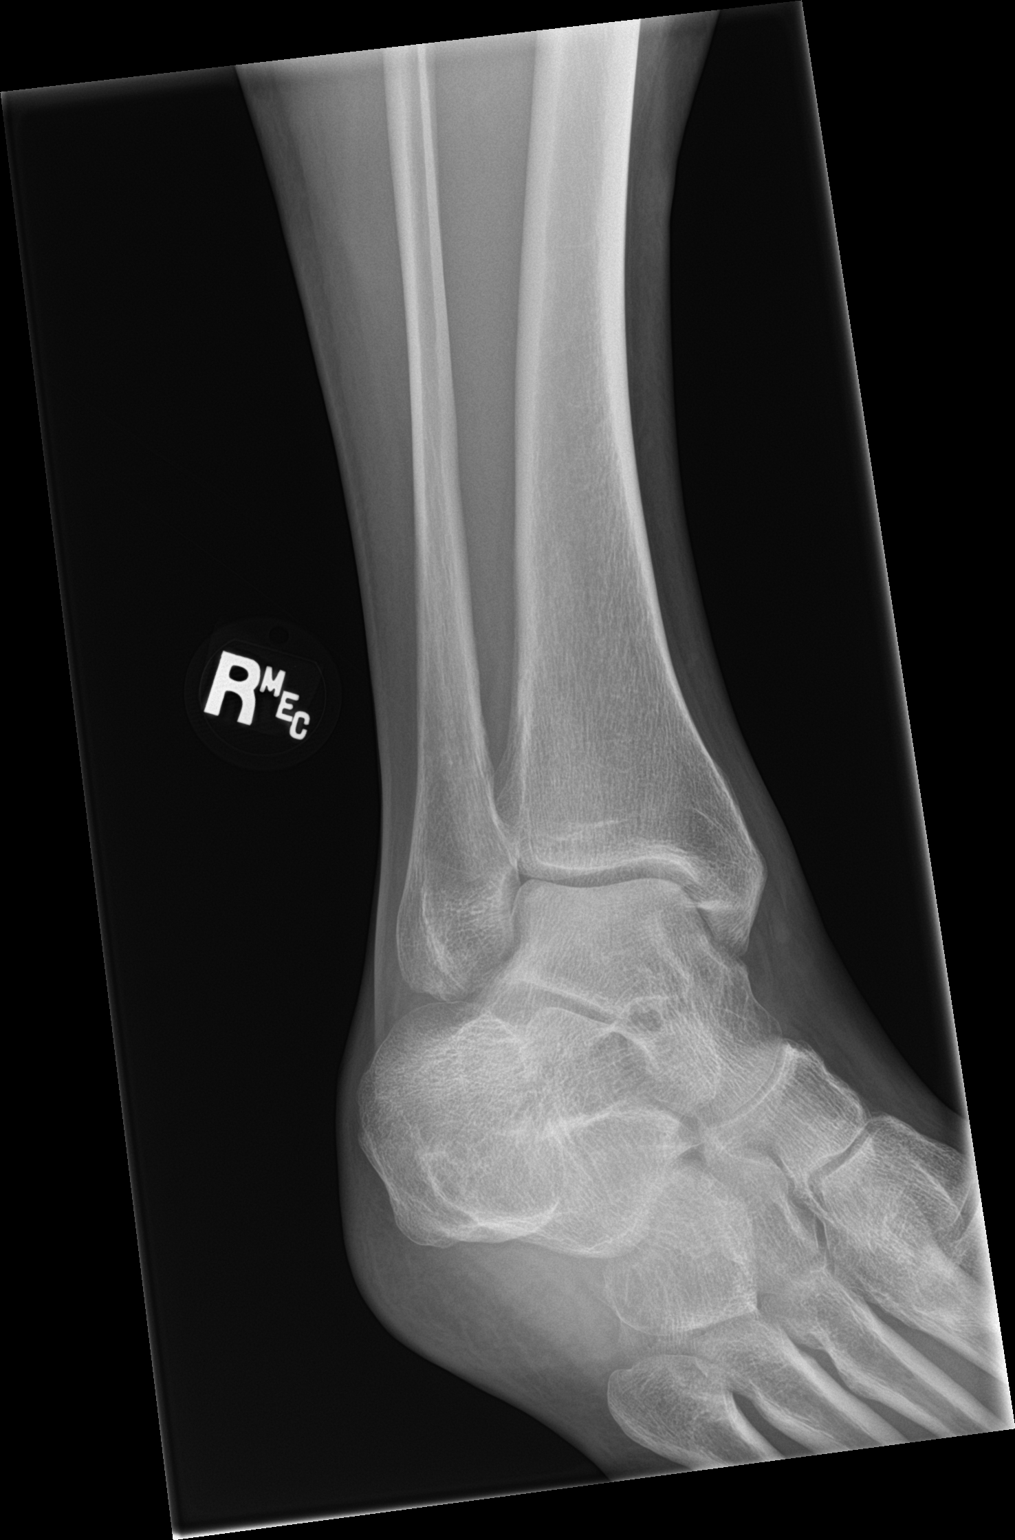

[ankle lat]
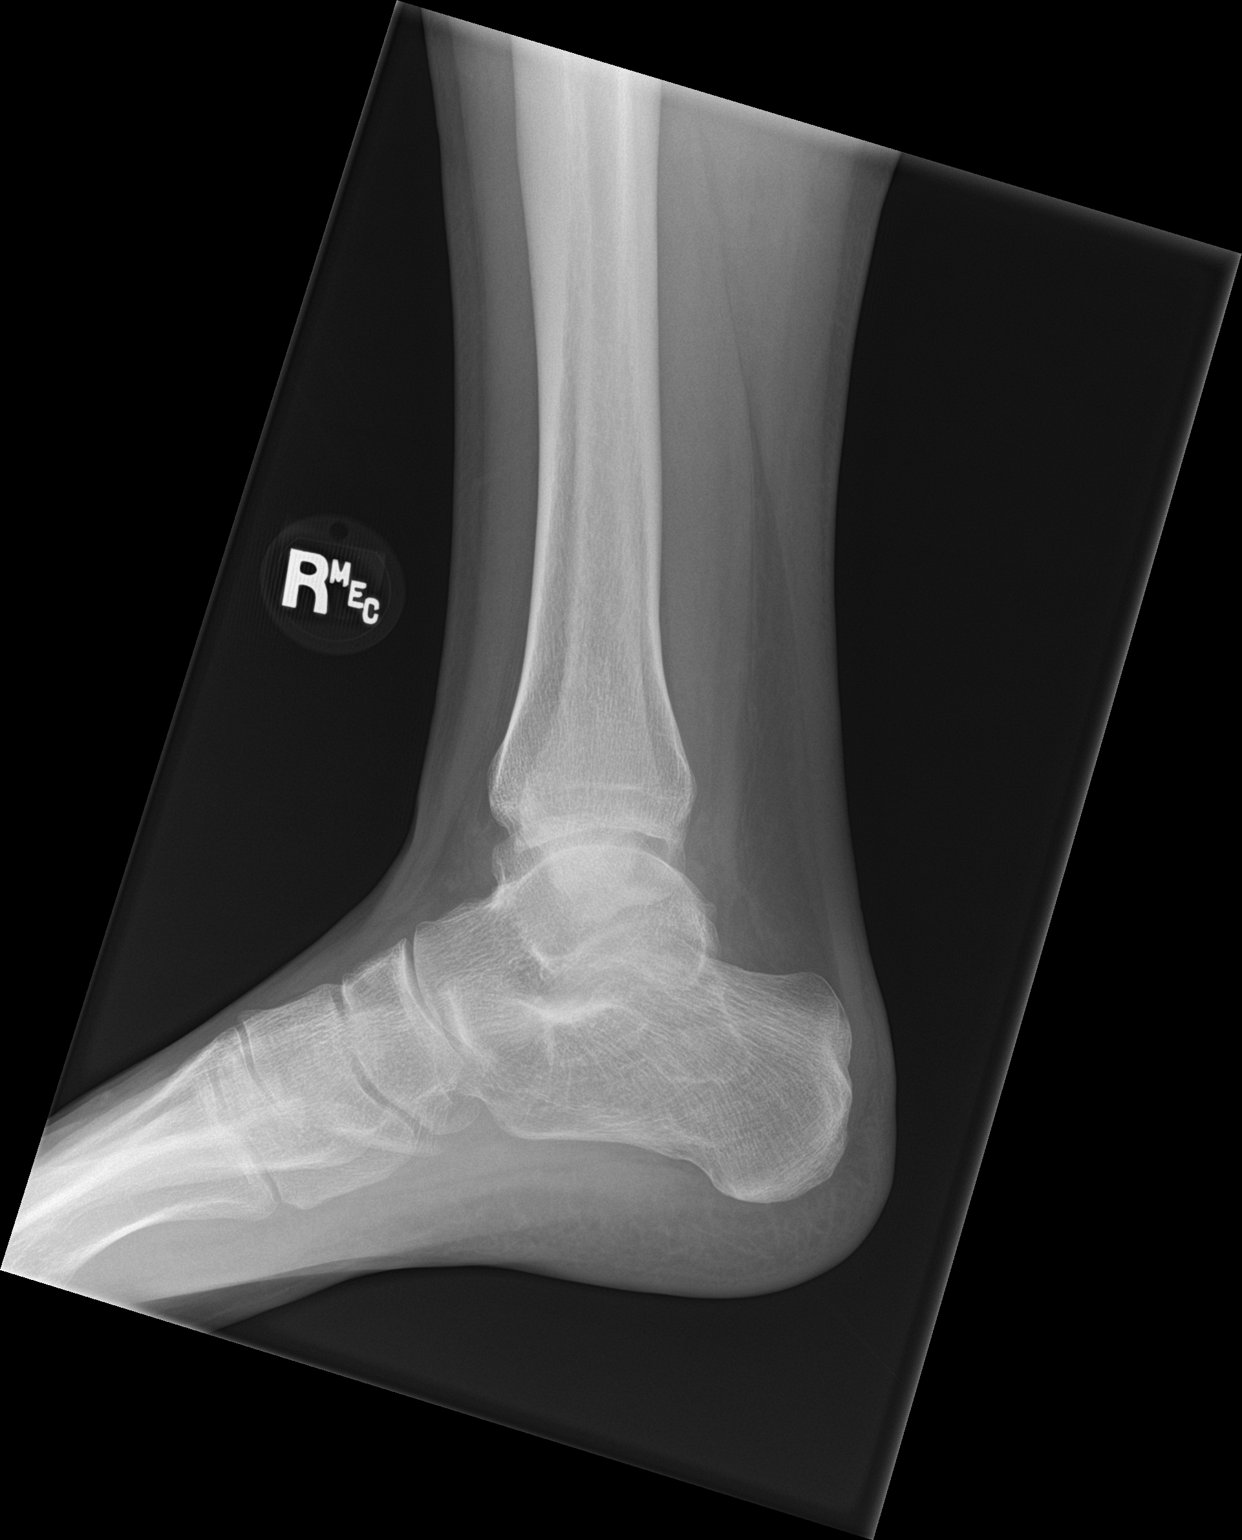

[3 of 3 positions shown; findings below may reference images not displayed]

FINDINGS: There is no evidence of fracture, dislocation, or joint effusion.
There is no evidence of arthropathy or other focal bone abnormality.
Soft tissues are unremarkable.
IMPRESSION: Negative.

## 2020-11-07 ENCOUNTER — Emergency Department (HOSPITAL_COMMUNITY)
Admission: EM | Admit: 2020-11-07 | Discharge: 2020-11-07 | Disposition: A | Discharge status: Home / Self Care | Payer: Medicaid Other | Attending: Emergency Medicine | Admitting: Emergency Medicine

## 2020-11-07 ENCOUNTER — Emergency Department (HOSPITAL_COMMUNITY): Payer: Medicaid Other

## 2020-11-07 ENCOUNTER — Encounter (HOSPITAL_COMMUNITY): Payer: Self-pay

## 2020-11-07 ENCOUNTER — Other Ambulatory Visit: Payer: Self-pay

## 2020-11-07 DIAGNOSIS — R0789 Other chest pain: Secondary | ICD-10-CM | POA: Insufficient documentation

## 2020-11-07 DIAGNOSIS — Y900 Blood alcohol level of less than 20 mg/100 ml: Secondary | ICD-10-CM | POA: Insufficient documentation

## 2020-11-07 DIAGNOSIS — F17200 Nicotine dependence, unspecified, uncomplicated: Secondary | ICD-10-CM | POA: Insufficient documentation

## 2020-11-07 DIAGNOSIS — T402X1A Poisoning by other opioids, accidental (unintentional), initial encounter: Secondary | ICD-10-CM | POA: Insufficient documentation

## 2020-11-07 DIAGNOSIS — T40601A Poisoning by unspecified narcotics, accidental (unintentional), initial encounter: Secondary | ICD-10-CM

## 2020-11-07 DIAGNOSIS — R000 Tachycardia, unspecified: Secondary | ICD-10-CM | POA: Insufficient documentation

## 2020-11-07 LAB — BASIC METABOLIC PANEL
Anion gap: 13 (ref 5–15)
BUN: 10 mg/dL (ref 6–20)
CO2: 22 mmol/L (ref 22–32)
Calcium: 8.8 mg/dL — ABNORMAL LOW (ref 8.9–10.3)
Chloride: 100 mmol/L (ref 98–111)
Creatinine, Ser: 1.21 mg/dL (ref 0.61–1.24)
GFR, Estimated: >60 mL/min (ref >60)
Glucose, Bld: 241 mg/dL — ABNORMAL HIGH (ref 70–99)
Potassium: 3.8 mmol/L (ref 3.5–5.1)
Sodium: 135 mmol/L (ref 135–145)

## 2020-11-07 LAB — CBC WITH DIFFERENTIAL/PLATELET
Abs Immature Granulocytes: 0.16 10*3/uL — ABNORMAL HIGH (ref 0.00–0.07)
Basophils Absolute: 0.1 10*3/uL (ref 0.0–0.1)
Basophils Relative: 1 %
Eosinophils Absolute: 0.2 10*3/uL (ref 0.0–0.5)
Eosinophils Relative: 1 %
HCT: 49.3 % (ref 39.0–52.0)
Hemoglobin: 16.2 g/dL (ref 13.0–17.0)
Immature Granulocytes: 1 %
Lymphocytes Relative: 30 %
Lymphs Abs: 4.3 10*3/uL — ABNORMAL HIGH (ref 0.7–4.0)
MCH: 31.7 pg (ref 26.0–34.0)
MCHC: 32.9 g/dL (ref 30.0–36.0)
MCV: 96.5 fL (ref 80.0–100.0)
Monocytes Absolute: 1 10*3/uL (ref 0.1–1.0)
Monocytes Relative: 7 %
Neutro Abs: 8.9 10*3/uL — ABNORMAL HIGH (ref 1.7–7.7)
Neutrophils Relative %: 60 %
Platelets: 298 10*3/uL (ref 150–400)
RBC: 5.11 MIL/uL (ref 4.22–5.81)
RDW: 12.7 % (ref 11.5–15.5)
WBC: 14.7 10*3/uL — ABNORMAL HIGH (ref 4.0–10.5)
nRBC: 0 % (ref 0.0–0.2)

## 2020-11-07 LAB — ETHANOL: Alcohol, Ethyl (B): <10 mg/dL (ref <10)

## 2020-11-07 MED ORDER — NALOXONE HCL 4 MG/0.1ML NA LIQD
1.0000 | Freq: Once | NASAL | Status: AC
  Administered 2020-11-07: 1 via NASAL
  Filled 2020-11-07: qty 4

## 2020-11-07 MED ORDER — DIPHENHYDRAMINE HCL 50 MG/ML IJ SOLN
12.5000 mg | Freq: Once | INTRAMUSCULAR | Status: AC
  Administered 2020-11-07: 12.5 mg via INTRAVENOUS
  Filled 2020-11-07: qty 1

## 2020-11-07 MED ORDER — NALOXONE HCL 2 MG/2ML IJ SOSY
PREFILLED_SYRINGE | INTRAMUSCULAR | Status: AC
  Administered 2020-11-07: 2 mg via INTRAVENOUS
  Filled 2020-11-07: qty 2

## 2020-11-07 MED ORDER — ONDANSETRON HCL 4 MG/2ML IJ SOLN
4.0000 mg | Freq: Once | INTRAMUSCULAR | Status: AC
  Administered 2020-11-07: 4 mg via INTRAVENOUS
  Filled 2020-11-07: qty 2

## 2020-11-07 MED ORDER — PROCHLORPERAZINE EDISYLATE 10 MG/2ML IJ SOLN
5.0000 mg | Freq: Once | INTRAMUSCULAR | Status: AC
  Administered 2020-11-07: 5 mg via INTRAVENOUS
  Filled 2020-11-07: qty 2

## 2020-11-07 MED ORDER — NALOXONE HCL 2 MG/2ML IJ SOSY: 2.0000 mg | PREFILLED_SYRINGE | Freq: Once | INTRAMUSCULAR | Status: AC

## 2020-11-07 MED ORDER — ONDANSETRON HCL 4 MG/2ML IJ SOLN: 4.0000 mg | Freq: Once | INTRAMUSCULAR | Status: DC

## 2020-11-07 MED ORDER — SODIUM CHLORIDE 0.9 % IV BOLUS
1000.0000 mL | Freq: Once | INTRAVENOUS | Status: AC
  Administered 2020-11-07: 1000 mL via INTRAVENOUS

## 2020-11-07 NOTE — ED Notes (Signed)
Alert, interactive with xray tech

## 2020-11-07 NOTE — ED Notes (Signed)
Hypoxic, SPO2 86% when he took his Oxygen off. Returned to 2L [place].

## 2020-11-07 NOTE — ED Notes (Signed)
Pt initially given 4L [place] due to O2 Sats dropping to 79%, no improvement. Patient is now on 15L NRB with O2 Sats improving at 97%.

## 2020-11-07 NOTE — ED Notes (Signed)
Pt ambulated in the room with pulse ox, O2 Sats remained at 97% RA.

## 2020-11-07 NOTE — ED Notes (Addendum)
Called pt's cell phone [phone] with RN phone. Pt speaking with his girlfriend, she is on the way. Repetitive questions remain.

## 2020-11-07 NOTE — ED Notes (Signed)
SPO2 dropped to 86% when he took his [place] off. Returned to [place] 5L. EDP speaking with pt a tthis time.

## 2020-11-07 NOTE — ED Notes (Signed)
Pt uncooperative, poor insight, poor judgment, states I feel fine, wanted NRB off, quickly de-satted to 84% RA, returned to [place] 2L, will continue to monitor. Repetitive questions of what happened, how did I get here where is my gf, where is my cell phone. Pending lab results, cxr, and urine sample. Narcan currently on board, given at 1531.

## 2020-11-07 NOTE — ED Notes (Signed)
Alert, NAD, calm. Ambulatory in room, pacing. GF at [facility].

## 2020-11-07 NOTE — ED Notes (Signed)
Updated, oriented, repetitive questions. Answers written on marker board.

## 2020-11-07 NOTE — Discharge Instructions (Addendum)
There is help if you need it.  Please do not use dirty needles, this could cause you a severe infection to your skin, heart or spinal cord.  This could kill you or leave you permanently disabled.  There was a recent study done at [facility] that showed that the risk of death for someone that had unintentionally overdosed on narcotics was as high as 15% in the next year.  This is much higher than most every other medical condition.  [place] Solution to the Opioid Problem (GCSTOP) Fixed; mobile; peer-based Chase Holleman [phone] [email or web address] Fixed site exchange at [employer], Wednesdays (2-5pm) and Thursdays (4-8pm). [address] [place], [place] [address] Call or text to arrange mobile and peer exchange, Mondays (1-4pm) and Fridays (4-7pm). Serving [place] [email or web address]  Suboxone clinic: [facility] [address] [place], [address]  [facility] [address] [place], [address]  [facility] [address]  [address]  

## 2020-11-07 NOTE — ED Notes (Signed)
Given sprite per request, sleeping upon return, VSS. Will monitor.
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
## 2020-11-07 NOTE — ED Notes (Signed)
O2 increased to 5L [place] to reach 92%

## 2020-11-07 NOTE — ED Notes (Signed)
Dr. Adela Lank at [facility] speaking with pt and s.o./GF.

## 2020-11-07 NOTE — ED Notes (Signed)
Reluctantly tolerating treatment/ care at this time. Cxr at [facility]. SPO2 88-92% on 5L.

## 2020-11-07 NOTE — ED Notes (Signed)
Unable to get pt to sign MSE due to confusion 
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
## 2020-11-24 DEATH — deceased

## 2021-01-19 IMAGING — CR DG CHEST 2V
1 series · 2 of 2 positions shown · non-contrast
Comparison: None.

CLINICAL DATA: Congestion and sore throat

EXAM:
CHEST - 2 VIEW

[Series 1: dg chest 2 view · 0.14mm/px · 2 of 2 slices shown]
[im 1/2]
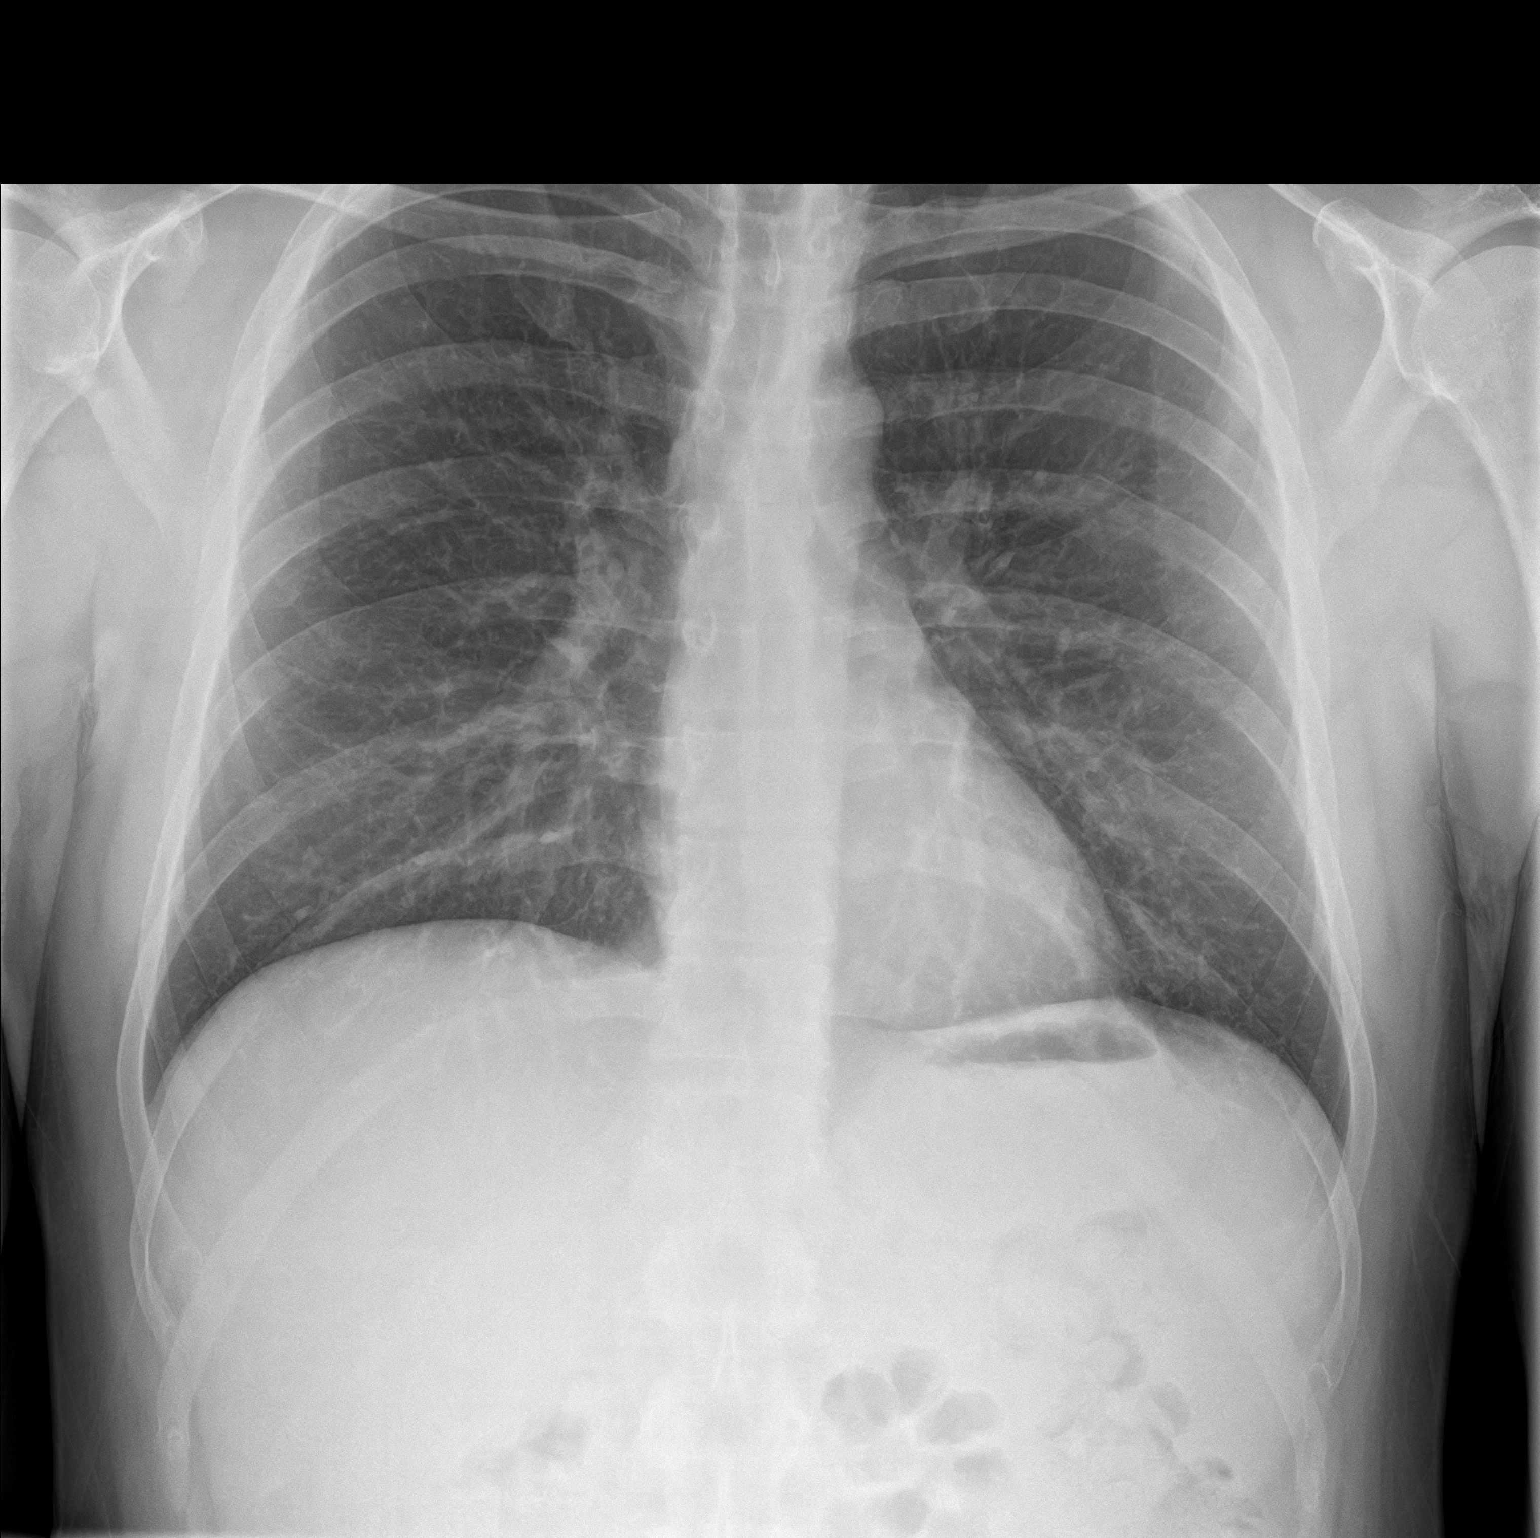
[im 2/2]
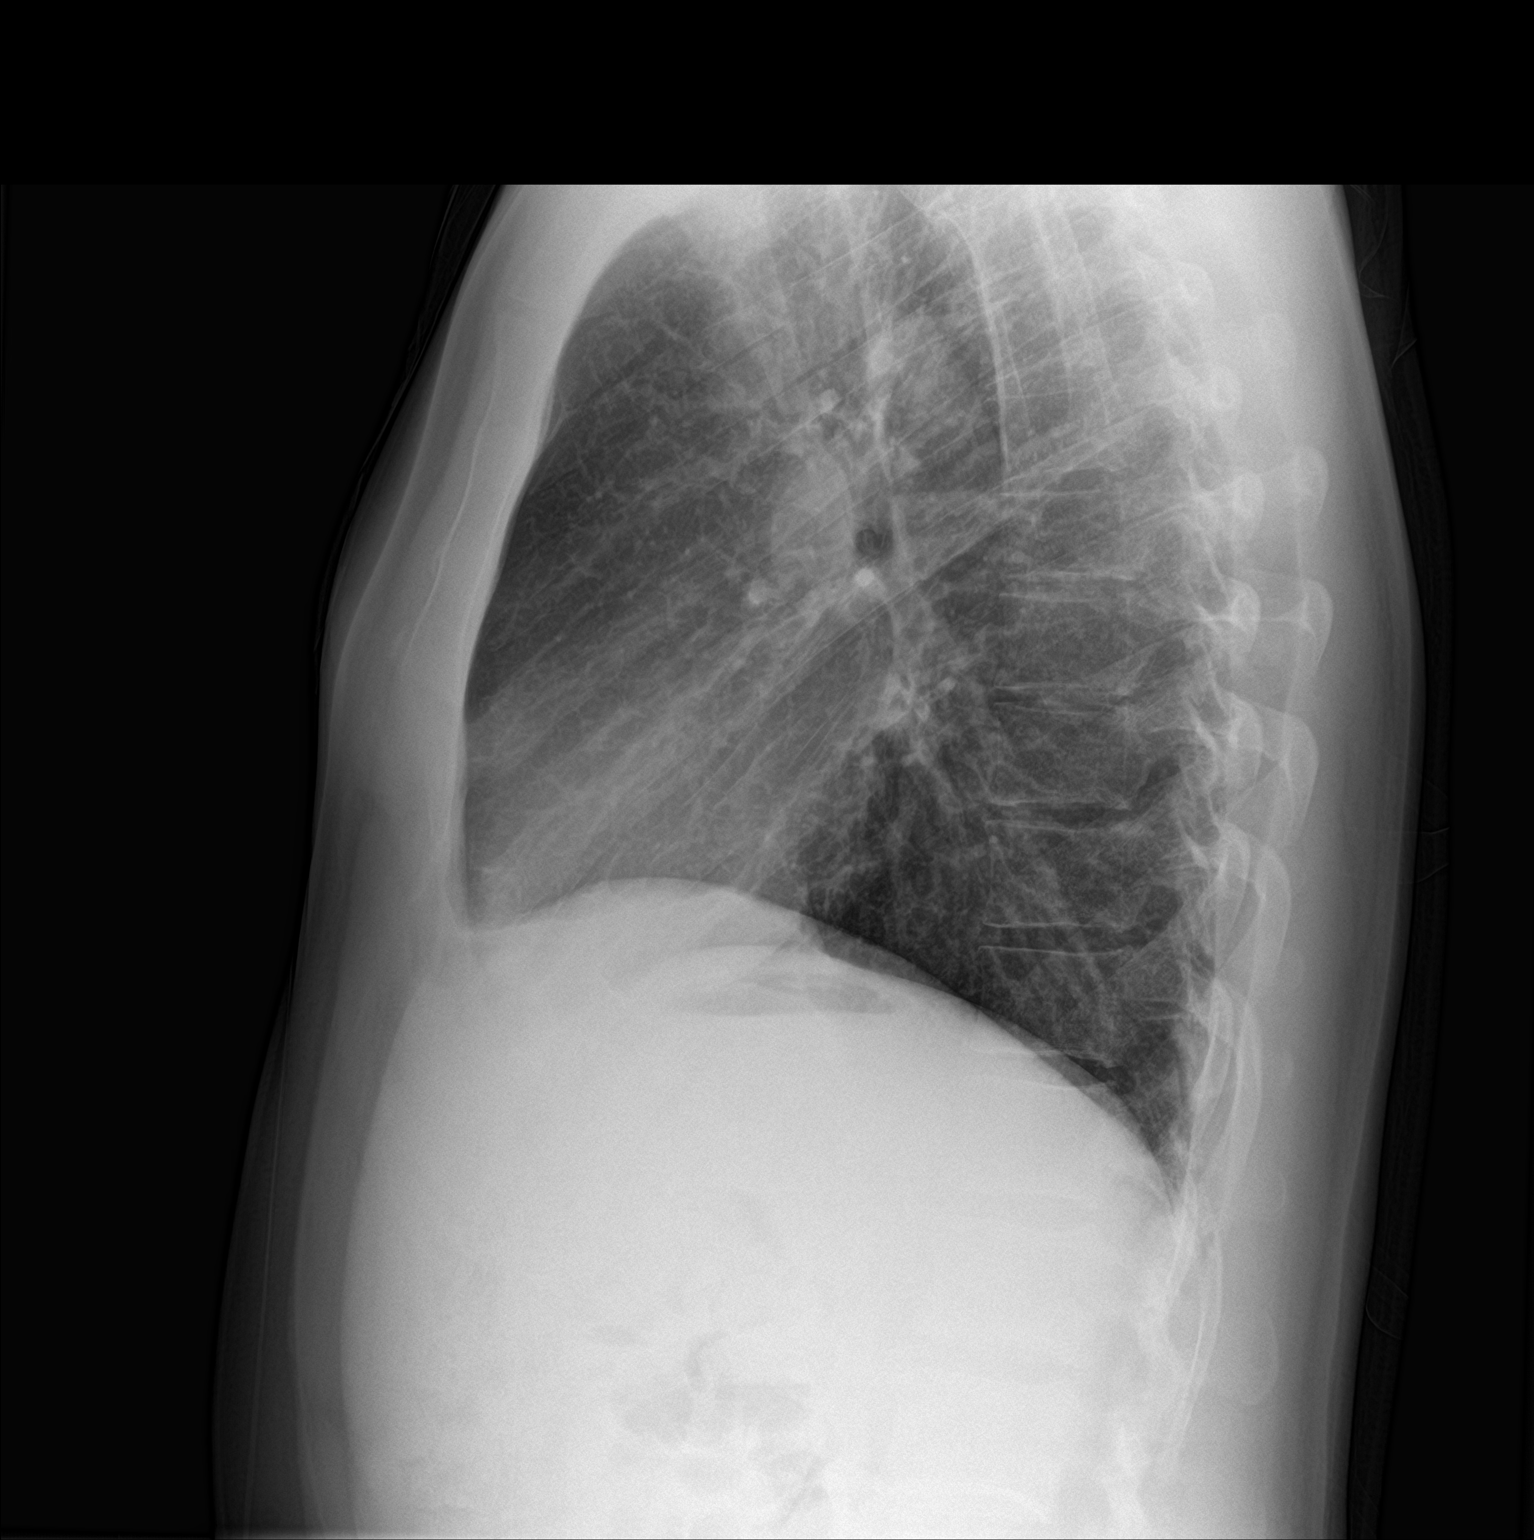

[2 of 2 positions shown; findings below may reference images not displayed]

FINDINGS: The heart size and mediastinal contours are within normal limits.
Both lungs are clear. The visualized skeletal structures are
unremarkable.
IMPRESSION: No active cardiopulmonary disease.
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
